# Patient Record
Sex: Male | Born: 1940 | Race: White | Hispanic: No | Marital: Married | Smoking: Former smoker
Health system: Southern US, Community
[De-identification: ages and names within clinical notes are randomized; demographics above are authoritative.]

## PROBLEM LIST (undated history)

## (undated) DIAGNOSIS — E785 Hyperlipidemia, unspecified: Secondary | ICD-10-CM

## (undated) DIAGNOSIS — I10 Essential (primary) hypertension: Secondary | ICD-10-CM

## (undated) DIAGNOSIS — F329 Major depressive disorder, single episode, unspecified: Secondary | ICD-10-CM

## (undated) DIAGNOSIS — F419 Anxiety disorder, unspecified: Secondary | ICD-10-CM

## (undated) DIAGNOSIS — F32A Depression, unspecified: Secondary | ICD-10-CM

## (undated) HISTORY — PX: ROTATOR CUFF REPAIR: SHX139

## (undated) HISTORY — PX: ELBOW FRACTURE SURGERY: SHX616

## (undated) HISTORY — DX: Anxiety disorder, unspecified: F41.9

---

## 1964-02-12 HISTORY — PX: APPENDECTOMY: SHX54

## 1997-09-06 ENCOUNTER — Inpatient Hospital Stay (HOSPITAL_COMMUNITY): Admission: EM | Admit: 1997-09-06 | Discharge: 1997-09-16 | Payer: Self-pay | Admitting: Emergency Medicine

## 2002-12-27 ENCOUNTER — Emergency Department (HOSPITAL_COMMUNITY): Admission: EM | Admit: 2002-12-27 | Discharge: 2002-12-28 | Payer: Self-pay | Admitting: Emergency Medicine

## 2005-03-17 ENCOUNTER — Emergency Department (HOSPITAL_COMMUNITY): Admission: EM | Admit: 2005-03-17 | Discharge: 2005-03-17 | Payer: Self-pay | Admitting: Emergency Medicine

## 2005-06-20 ENCOUNTER — Ambulatory Visit (HOSPITAL_BASED_OUTPATIENT_CLINIC_OR_DEPARTMENT_OTHER): Admission: RE | Admit: 2005-06-20 | Discharge: 2005-06-21 | Payer: Self-pay | Admitting: Orthopedic Surgery

## 2007-06-12 ENCOUNTER — Emergency Department (HOSPITAL_COMMUNITY): Admission: EM | Admit: 2007-06-12 | Discharge: 2007-06-12 | Payer: Self-pay | Admitting: Emergency Medicine

## 2008-03-17 ENCOUNTER — Encounter (INDEPENDENT_AMBULATORY_CARE_PROVIDER_SITE_OTHER): Payer: Self-pay | Admitting: Orthopedic Surgery

## 2008-03-17 ENCOUNTER — Ambulatory Visit (HOSPITAL_BASED_OUTPATIENT_CLINIC_OR_DEPARTMENT_OTHER): Admission: RE | Admit: 2008-03-17 | Discharge: 2008-03-17 | Payer: Self-pay | Admitting: Orthopedic Surgery

## 2010-06-26 NOTE — Op Note (Signed)
[name], [name]                  ACCOUNT NO.:  [id number]   MEDICAL RECORD NO.:  [id number]          PATIENT TYPE:  AMB   LOCATION:  DSC                          FACILITY:  [facility]   PHYSICIAN:  Katy Fitch. Sypher, M.D. DATE OF BIRTH:  1940/05/28   DATE OF PROCEDURE:  03/17/2008  DATE OF DISCHARGE:                               OPERATIVE REPORT   PREOPERATIVE DIAGNOSIS:  Enlarging mass, left palm, question of trauma  history, present for 4 months and painful.   POSTOPERATIVE DIAGNOSIS:  Probable epidermal inclusion cyst.   OPERATIONS:  Excisional biopsy of subdermal mass, left palm.   OPERATIONS:  Katy Fitch. Sypher, MD   ASSISTANT:  Marveen Reeks Dasnoit, PA-C   ANESTHESIA:  Lidocaine 2%, field block of left palm.  No supplemental  sedation was provided.  This was performed in the Minor Operating Room  setting.   INDICATIONS:  Edwin Randall is a well-known patient, referred in the past  by Dr. Merri Brunette for treatment of a right rotator cuff tear.   Edwin Randall has had a mass enlarging in the left palm which is painful and  bothersome.  He thought this might be a wart or a splinter.  It did not  resolve spontaneously, therefore he sought a Hand Surgery consult.  Clinical examination revealed a mass consistent with an epidermal  inclusion cyst or foreign body granuloma.  We recommended excision under  local anesthesia.   PROCEDURE:  Edwin Randall was brought to the operating room and placed in  the supine position upon the operating table.  Following routine  Betadine prep of the palm, 2% lidocaine, a total of 4 mL, was  infiltrated at the base of the mass.  After 5 minutes, excellent  anesthesia was achieved.  The arm was then prepped with Betadine soap  and solution, sterilely draped.  A pneumatic tourniquet was applied to  the proximal brachium.   Due to mild systolic hypertension, the tourniquet was set at 300 mmHg.  The hand and arm were exsanguinated with direct compression  followed by  inflation of the tourniquet.  The mass was ellipsed.  It measured 9 x 10  mm.  A skin fragment measuring approximate 1.5 x 1.2 cm was resected  with the mass down to the level of the palmar fascia.  The mass was  passed off on block.  This would be sent to the lab in formalin for  pathologic evaluation.  My gross clinical impression was that this more  likely did not represent an epidermal inclusion cyst.  The wound is then  closed with series of simple 5-0 nylon sutures.   The wound is dressed with Xeroflo sterile gauze and Ace bandage.   Edwin Randall is instructed to keep his palm dry for 5 days.  He should  begin using foam Band-Aids 3-4 days following surgery.  We will see him  back in followup in a week to 10 days for suture removal and discussion  of his pathology results.      Katy Fitch Sypher, M.D.  Electronically Signed  RVS/MEDQ  D:  03/17/2008  T:  03/17/2008  Job:  [id number]   cc:   Soyla Murphy. Renne Crigler, M.D.

## 2010-06-29 NOTE — Op Note (Signed)
NAMEJUANDIEGO, Edwin Randall                  ACCOUNT NO.:  [id number]   MEDICAL RECORD NO.:  [id number]          PATIENT TYPE:  AMB   LOCATION:  DSC                          FACILITY:  [facility]   PHYSICIAN:  Katy Fitch. Sypher, M.D. DATE OF BIRTH:  1940-03-05   DATE OF PROCEDURE:  06/20/2005  DATE OF DISCHARGE:  06/21/2005                                 OPERATIVE REPORT   PREOPERATIVE DIAGNOSIS:  Massive right rotator cuff acute on chronic  avulsion with a type 3 acromion and severe acromioclavicular degenerative  arthritis, rule out cuff tear arthropathy.   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   OPERATION:  1.  Reconstruction of right rotator cuff including repair of teres minor,      infraspinatus and a portion of the supraspinatus, followed by a bursal      coverage of the anterior humeral head to protect the biceps and the      rotator cuff reconstruction.  2.  Subacromial decompression with resection of a large ossified      coracoacromial ligament.  3.  Resection of distal clavicle.  4.  Arthroscopic evaluation of rotator cuff and glenohumeral joint prior to      open repair, identifying minor labral degenerative changes.   OPERATING SURGEON:  Katy Fitch. Sypher, M.D.   ASSISTANT:  Molly Maduro Dasnoit PA-C.   ANESTHESIA:  General endotracheal supplemented by right interscalene block,  supervising anesthesiologist is Dr. Sampson Goon.   INDICATIONS:  Edwin Randall a 70 year old man referred by Dr. Renne Crigler for  evaluation and management of an acutely painful and weak right shoulder.   Edwin Randall reported an abrupt loss of function of his right shoulder in mid  March when he was using his chainsaw.   At that time he was trying to cut overhead and lost control of his arm.  Since that time he has had pain with shoulder motion and marked weakness  of  abduction and forward flexion of his shoulder.   He was seen in consultation in our office and noted to have clinical signs  of a massive rotator cuff tear.   Plain films at the time demonstrated exceptional subacromial anatomy with  ossification of the coracoacromial ligament and a very large type 3 anterior  bone spur.   He had significant degenerative of AC joint and sclerotic change at the  greater tuberosity consistent with chronic impingement.  There were no signs  of severe glenohumeral degenerative arthritis.   An MRI of the shoulder was obtained, which revealed evidence of a chronic  rotator cuff avulsion with fatty substitution in the supraspinatus muscle.   We had lengthy informed consent with Mr. Edwin Randall.   He was advised that his rotator cuff tear, being acute on chronic, may be  irreparable.  We offered a series of possible interventions including  diagnostic arthroscopy with debridement, followed by possible greater  tuberosityplasty or partial repair of his cuff.  Another alternative should  he have significant pain  and be found to have significant glenohumeral  arthritis due to cuff tear arthropathy would be hemiarthroplasty of the  right shoulder.   After informed consent, he is brought to the operating room at this time  anticipating a procedure that is primarily designed for examination of the  rotator cuff, the quality of the glenohumeral structures and to gauge  whether not he is a candidate for hemiarthroplasty.   He was advised preoperatively should we find evidence of a repairable  rotator cuff injury, we would proceed with partial or complete repair at  this time.   PROCEDURE:  [name] was brought to the operating room and placed in  supine position on the operating table.  Following an anesthesia consult by  Dr. Sampson Goon, general anesthesia by endotracheal technique was advised  with an interscalene block for postoperative analgesia.   The  interscalene block was placed without difficulty in the holding area.   Ancef 1 g was administered as an IV prophylactic antibiotic.   [name] was transferred to room #5, placed in supine position upon the  operating table and under Dr. Jarrett Ables supervision, general endotracheal  anesthesia induced.  He was carefully positioned in the beach-chair position  with a of aid of a torso and head holder designed for shoulder arthroscopy,  followed by prep of the right arm and forequarter with DuraPrep.  Impervious  arthroscopy drapes were applied, followed by instrumentation of the shoulder  with the [employer] arthroscope through a standard posterior viewing portal.   Diagnostic arthroscopy revealed intact hyaline articular cartilage surfaces  on the glenoid and humeral head.  The inferior labrum was degenerative.  There was a complete avulsion of the rotator cuff exposing the biceps  tendon.  The tear extended from the superior subscapularis all the way  posteriorly including the teres minor.   A crescentic, retracted cuff margin was visualized.   This appeared to be mobile and some components of it were hemorrhagic,  suggesting acute on chronic injury.   We made the decision at this point to proceed with attempted partial repair  of the rotator cuff.   A 6 cm incision was fashioned from the [facility] joint across the anterior  acromion.  The anterior third of the deltoid was elevated with a 15 blade  off the anterior acromion and the ossified coracoacromial ligament  identified.   With the aid of an oscillating saw and rongeur, the type 3 acromial spur was  removed in the acromion was leveled to a type 1 morphology.   The lateral flare the acromion was rounded with a power bur.  A complete  bursectomy was accomplished, and with great effort the teres minor and  infraspinatus tendons were recovered from the posterior aspect of the humeral neck region.   The entire head had buttonholed  through remnants of the cuff.  The Long head  of the biceps had a stable anchor and was normal through the rotator  interval.  The superior subscapularis was degenerative; however, I elected  not to proceed with the tendon transfer in that at some point the future Mr.  Akhtar may require an implant arthroplasty of the shoulder and the  subscapularis would be a useful landmark as well as important to the  postoperative stability of an implant arthroplasty.   The cuff was retrieved with two grasping sutures of #2 FiberWire, followed  by use of a power bur to decorticate the greater tuberosity at the  insertions of the teres minor,  infraspinatus and posterior supraspinatus.   The tendon was advanced anteriorly and laterally and inset with two  biocorkscrew anchors for total of four mattress sutures insetting the margin  medially and a single through-bone McLaughlin suture advancing the tendon  anteriorly and laterally.   The bursa and remnants of the supraspinous tendon were then gathered  medially and anteriorly and with a baseball-type stitch, this was all  repaired over the humeral head leading to soft tissue interposition between  the acromion and the decorticated greater tuberosity.   This was repaired with excellent coverage of the humeral head.   This also reinforced the repair and should prevent recurrent posterior  subluxation of the infraspinatus and teres minor.   After completion of the repair, the subacromial space and bursa were  thoroughly lavage with sterile saline and redundant bursa resected with  scissors and rongeur dissection.  After hemostasis was achieved, the bursa  was thoroughly irrigated with the arthroscopic pump, followed by resection  of the distal 15 mm of clavicle.   After the marginal osteophytes at the [facility] joint were removed, the anterior  deltoid and trapezius muscles were repaired with mattress suture of #2  FiberWire, closing the dead space created  by distal clavicle resection,  followed by anatomic repair of the anterior deltoid to the acromion with  through-tendon and through-periosteal sutures.   A satisfactory reconstruction the cuff was achieved with a strong repair of  the deltoid.   Mr. Arney will be advised to perform passive exercises only for  approximately six weeks due to some jeopardized areas of the infraspinatus  that were noted posteriorly.   Will not stress the repair with any active motion until approximately eight  weeks postop.  There were no apparent complications.      Katy Fitch Sypher, M.D.  Electronically Signed     RVS/MEDQ  D:  06/20/2005  T:  06/21/2005  Job:  [id number]   cc:   Soyla Murphy. Renne Crigler, M.D.  Fax: [phone]

## 2011-05-15 DIAGNOSIS — Z79899 Other long term (current) drug therapy: Secondary | ICD-10-CM | POA: Diagnosis not present

## 2011-05-15 DIAGNOSIS — I10 Essential (primary) hypertension: Secondary | ICD-10-CM | POA: Diagnosis not present

## 2011-05-15 DIAGNOSIS — Z125 Encounter for screening for malignant neoplasm of prostate: Secondary | ICD-10-CM | POA: Diagnosis not present

## 2011-05-15 DIAGNOSIS — E7800 Pure hypercholesterolemia, unspecified: Secondary | ICD-10-CM | POA: Diagnosis not present

## 2011-05-20 DIAGNOSIS — E7800 Pure hypercholesterolemia, unspecified: Secondary | ICD-10-CM | POA: Diagnosis not present

## 2011-05-20 DIAGNOSIS — F411 Generalized anxiety disorder: Secondary | ICD-10-CM | POA: Diagnosis not present

## 2011-05-20 DIAGNOSIS — Z23 Encounter for immunization: Secondary | ICD-10-CM | POA: Diagnosis not present

## 2011-05-20 DIAGNOSIS — I10 Essential (primary) hypertension: Secondary | ICD-10-CM | POA: Diagnosis not present

## 2011-05-20 DIAGNOSIS — M109 Gout, unspecified: Secondary | ICD-10-CM | POA: Diagnosis not present

## 2012-05-15 DIAGNOSIS — Z0000 Encounter for general adult medical examination without abnormal findings: Secondary | ICD-10-CM | POA: Diagnosis not present

## 2012-05-15 DIAGNOSIS — I10 Essential (primary) hypertension: Secondary | ICD-10-CM | POA: Diagnosis not present

## 2012-05-15 DIAGNOSIS — Z79899 Other long term (current) drug therapy: Secondary | ICD-10-CM | POA: Diagnosis not present

## 2012-05-15 DIAGNOSIS — Z125 Encounter for screening for malignant neoplasm of prostate: Secondary | ICD-10-CM | POA: Diagnosis not present

## 2012-05-15 DIAGNOSIS — E7800 Pure hypercholesterolemia, unspecified: Secondary | ICD-10-CM | POA: Diagnosis not present

## 2012-05-21 DIAGNOSIS — Z1212 Encounter for screening for malignant neoplasm of rectum: Secondary | ICD-10-CM | POA: Diagnosis not present

## 2012-05-21 DIAGNOSIS — I10 Essential (primary) hypertension: Secondary | ICD-10-CM | POA: Diagnosis not present

## 2012-05-21 DIAGNOSIS — F411 Generalized anxiety disorder: Secondary | ICD-10-CM | POA: Diagnosis not present

## 2012-05-21 DIAGNOSIS — M109 Gout, unspecified: Secondary | ICD-10-CM | POA: Diagnosis not present

## 2012-05-21 DIAGNOSIS — Z87820 Personal history of traumatic brain injury: Secondary | ICD-10-CM | POA: Diagnosis not present

## 2012-07-02 DIAGNOSIS — E871 Hypo-osmolality and hyponatremia: Secondary | ICD-10-CM | POA: Diagnosis not present

## 2012-10-06 DIAGNOSIS — W19XXXA Unspecified fall, initial encounter: Secondary | ICD-10-CM | POA: Diagnosis not present

## 2012-10-06 DIAGNOSIS — I10 Essential (primary) hypertension: Secondary | ICD-10-CM | POA: Diagnosis not present

## 2012-10-06 DIAGNOSIS — M545 Low back pain: Secondary | ICD-10-CM | POA: Diagnosis not present

## 2013-05-26 DIAGNOSIS — E7800 Pure hypercholesterolemia, unspecified: Secondary | ICD-10-CM | POA: Diagnosis not present

## 2013-05-26 DIAGNOSIS — Z125 Encounter for screening for malignant neoplasm of prostate: Secondary | ICD-10-CM | POA: Diagnosis not present

## 2013-05-26 DIAGNOSIS — I10 Essential (primary) hypertension: Secondary | ICD-10-CM | POA: Diagnosis not present

## 2013-05-26 DIAGNOSIS — Z0000 Encounter for general adult medical examination without abnormal findings: Secondary | ICD-10-CM | POA: Diagnosis not present

## 2013-05-26 DIAGNOSIS — Z7982 Long term (current) use of aspirin: Secondary | ICD-10-CM | POA: Diagnosis not present

## 2013-05-31 DIAGNOSIS — E7800 Pure hypercholesterolemia, unspecified: Secondary | ICD-10-CM | POA: Diagnosis not present

## 2013-05-31 DIAGNOSIS — I70209 Unspecified atherosclerosis of native arteries of extremities, unspecified extremity: Secondary | ICD-10-CM | POA: Diagnosis not present

## 2013-05-31 DIAGNOSIS — E8881 Metabolic syndrome: Secondary | ICD-10-CM | POA: Diagnosis not present

## 2013-05-31 DIAGNOSIS — I10 Essential (primary) hypertension: Secondary | ICD-10-CM | POA: Diagnosis not present

## 2013-05-31 DIAGNOSIS — M6740 Ganglion, unspecified site: Secondary | ICD-10-CM | POA: Diagnosis not present

## 2013-08-11 DIAGNOSIS — M6740 Ganglion, unspecified site: Secondary | ICD-10-CM | POA: Diagnosis not present

## 2013-11-30 DIAGNOSIS — E780 Pure hypercholesterolemia: Secondary | ICD-10-CM | POA: Diagnosis not present

## 2013-11-30 DIAGNOSIS — I10 Essential (primary) hypertension: Secondary | ICD-10-CM | POA: Diagnosis not present

## 2013-12-06 DIAGNOSIS — M71332 Other bursal cyst, left wrist: Secondary | ICD-10-CM | POA: Diagnosis not present

## 2013-12-06 DIAGNOSIS — I10 Essential (primary) hypertension: Secondary | ICD-10-CM | POA: Diagnosis not present

## 2014-01-05 DIAGNOSIS — M71332 Other bursal cyst, left wrist: Secondary | ICD-10-CM | POA: Diagnosis not present

## 2014-01-05 DIAGNOSIS — I10 Essential (primary) hypertension: Secondary | ICD-10-CM | POA: Diagnosis not present

## 2014-02-10 ENCOUNTER — Emergency Department (HOSPITAL_COMMUNITY): Payer: Medicare Other

## 2014-02-10 ENCOUNTER — Encounter (HOSPITAL_COMMUNITY): Payer: Self-pay | Admitting: *Deleted

## 2014-02-10 ENCOUNTER — Emergency Department (HOSPITAL_COMMUNITY)
Admission: EM | Admit: 2014-02-10 | Discharge: 2014-02-10 | Disposition: A | Discharge status: Home / Self Care | Payer: Medicare Other | Attending: Emergency Medicine | Admitting: Emergency Medicine

## 2014-02-10 DIAGNOSIS — I10 Essential (primary) hypertension: Secondary | ICD-10-CM | POA: Diagnosis not present

## 2014-02-10 DIAGNOSIS — S6992XA Unspecified injury of left wrist, hand and finger(s), initial encounter: Secondary | ICD-10-CM | POA: Diagnosis not present

## 2014-02-10 DIAGNOSIS — M25522 Pain in left elbow: Secondary | ICD-10-CM | POA: Diagnosis not present

## 2014-02-10 DIAGNOSIS — S42402A Unspecified fracture of lower end of left humerus, initial encounter for closed fracture: Secondary | ICD-10-CM | POA: Insufficient documentation

## 2014-02-10 DIAGNOSIS — Y9389 Activity, other specified: Secondary | ICD-10-CM | POA: Diagnosis not present

## 2014-02-10 DIAGNOSIS — Y9289 Other specified places as the place of occurrence of the external cause: Secondary | ICD-10-CM | POA: Insufficient documentation

## 2014-02-10 DIAGNOSIS — S59902A Unspecified injury of left elbow, initial encounter: Secondary | ICD-10-CM | POA: Diagnosis present

## 2014-02-10 DIAGNOSIS — S52022A Displaced fracture of olecranon process without intraarticular extension of left ulna, initial encounter for closed fracture: Secondary | ICD-10-CM | POA: Diagnosis not present

## 2014-02-10 DIAGNOSIS — W19XXXA Unspecified fall, initial encounter: Secondary | ICD-10-CM

## 2014-02-10 DIAGNOSIS — Y998 Other external cause status: Secondary | ICD-10-CM | POA: Diagnosis not present

## 2014-02-10 DIAGNOSIS — W010XXA Fall on same level from slipping, tripping and stumbling without subsequent striking against object, initial encounter: Secondary | ICD-10-CM | POA: Diagnosis not present

## 2014-02-10 DIAGNOSIS — M7989 Other specified soft tissue disorders: Secondary | ICD-10-CM | POA: Diagnosis not present

## 2014-02-10 HISTORY — DX: Essential (primary) hypertension: I10

## 2014-02-10 LAB — CBC WITH DIFFERENTIAL/PLATELET
Basophils Absolute: 0 10*3/uL (ref 0.0–0.1)
Basophils Relative: 0 % (ref 0–1)
Eosinophils Absolute: 0.1 10*3/uL (ref 0.0–0.7)
Eosinophils Relative: 1 % (ref 0–5)
HCT: 40.8 % (ref 39.0–52.0)
Hemoglobin: 14.2 g/dL (ref 13.0–17.0)
Lymphocytes Relative: 21 % (ref 12–46)
Lymphs Abs: 1.9 10*3/uL (ref 0.7–4.0)
MCH: 31.6 pg (ref 26.0–34.0)
MCHC: 34.8 g/dL (ref 30.0–36.0)
MCV: 90.7 fL (ref 78.0–100.0)
Monocytes Absolute: 0.8 10*3/uL (ref 0.1–1.0)
Monocytes Relative: 9 % (ref 3–12)
Neutro Abs: 6.3 10*3/uL (ref 1.7–7.7)
Neutrophils Relative %: 69 % (ref 43–77)
Platelets: 207 10*3/uL (ref 150–400)
RBC: 4.5 MIL/uL (ref 4.22–5.81)
RDW: 13.3 % (ref 11.5–15.5)
WBC: 9.2 10*3/uL (ref 4.0–10.5)

## 2014-02-10 LAB — COMPREHENSIVE METABOLIC PANEL
ALT: 38 U/L (ref 0–53)
AST: 41 U/L — ABNORMAL HIGH (ref 0–37)
Albumin: 3.8 g/dL (ref 3.5–5.2)
Alkaline Phosphatase: 57 U/L (ref 39–117)
Anion gap: 8 (ref 5–15)
BUN: 12 mg/dL (ref 6–23)
CO2: 27 mmol/L (ref 19–32)
Calcium: 9.2 mg/dL (ref 8.4–10.5)
Chloride: 105 mEq/L (ref 96–112)
Creatinine, Ser: 0.95 mg/dL (ref 0.50–1.35)
GFR calc Af Amer: >90 mL/min (ref >90)
GFR calc non Af Amer: 80 mL/min — ABNORMAL LOW (ref >90)
Glucose, Bld: 104 mg/dL — ABNORMAL HIGH (ref 70–99)
Potassium: 3.9 mmol/L (ref 3.5–5.1)
Sodium: 140 mmol/L (ref 135–145)
Total Bilirubin: 0.5 mg/dL (ref 0.3–1.2)
Total Protein: 6.1 g/dL (ref 6.0–8.3)

## 2014-02-10 LAB — TROPONIN I: Troponin I: <0.03 ng/mL (ref <0.031)

## 2014-02-10 LAB — ETHANOL: Alcohol, Ethyl (B): 139 mg/dL — ABNORMAL HIGH (ref 0–9)

## 2014-02-10 MED ORDER — OXYCODONE-ACETAMINOPHEN 5-325 MG PO TABS: 1.0000 | ORAL_TABLET | Freq: Four times a day (QID) | ORAL | Status: DC | PRN

## 2014-02-10 MED ORDER — OXYCODONE-ACETAMINOPHEN 5-325 MG PO TABS
1.0000 | ORAL_TABLET | Freq: Once | ORAL | Status: AC
  Administered 2014-02-10: 1 via ORAL
  Filled 2014-02-10: qty 1

## 2014-02-10 MED ORDER — HYDROMORPHONE HCL 1 MG/ML IJ SOLN
1.0000 mg | Freq: Once | INTRAMUSCULAR | Status: AC
  Administered 2014-02-10: 1 mg via INTRAVENOUS
  Filled 2014-02-10: qty 1

## 2014-02-10 MED ORDER — ONDANSETRON 4 MG PO TBDP
4.0000 mg | ORAL_TABLET | Freq: Once | ORAL | Status: AC
  Administered 2014-02-10: 4 mg via ORAL
  Filled 2014-02-10: qty 1

## 2014-02-10 NOTE — Discharge Instructions (Signed)
Elbow Fracture, Simple A fracture is a break in one of the bones.When fractures are not displaced or separated, they may be treated with only a sling or splint. The sling or splint may only be required for two to three weeks. In these cases, often the elbow is put through early range of motion exercises to prevent the elbow from getting stiff. DIAGNOSIS  The diagnosis (learning what is wrong) of a fractured elbow is made by x-ray. These may be required before and after the elbow is put into a splint or cast. X-rays are taken after to make sure the bone pieces have not moved. HOME CARE INSTRUCTIONS   Only take over-the-counter or prescription medicines for pain, discomfort, or fever as directed by your caregiver.  If you have a splint held on with an elastic wrap and your hand or fingers become numb or cold and blue, loosen the wrap and reapply more loosely. See your caregiver if there is no relief.  You may use ice for twenty minutes, four times per day, for the first two to three days.  Use your elbow as directed.  See your caregiver as directed. It is very important to keep all follow-up referrals and appointments in order to avoid any long-term problems with your elbow including chronic pain or stiffness. SEEK IMMEDIATE MEDICAL CARE IF:   There is swelling or increasing pain in elbow.  You begin to lose feeling or experience numbness or tingling in your hand or fingers.  You develop swelling of the hand and fingers.  You get a cold or blue hand or fingers on affected side. MAKE SURE YOU:   Understand these instructions.  Will watch your condition.  Will get help right away if you are not doing well or get worse. Document Released: 01/22/2001 Document Revised: 04/22/2011 Document Reviewed: 12/13/2008 Exit[employer] Patient Information 2015 [employer]. This information is not intended to replace advice given to you by your health care provider. Make sure you discuss any questions you  have with your health care provider.  

## 2014-02-10 NOTE — ED Notes (Signed)
Pt reports falling today. Has hx of arm injury in past, reports normally that his arm does not flex or extend since injury, "he felt something move today when he fell." pt thinks arm is broken, he immobilized it pta with old splint. +radial pulse and able to move digits.

## 2014-02-10 NOTE — ED Provider Notes (Signed)
CSN: [id number]     Arrival date & time 02/10/14  1556 History   First MD Initiated Contact with Patient 02/10/14 1815     Chief Complaint  Patient presents with  . Fall  . Arm Injury     (Consider location/radiation/quality/duration/timing/severity/associated sxs/prior Treatment) Patient is a 73 y.o. male presenting with fall and arm injury. The history is provided by the patient.  Fall This is a new problem. Pertinent negatives include no chest pain, no abdominal pain and no shortness of breath.  Arm Injury Associated symptoms: no fatigue    patient slipped and fell prior to arrival. He landed on his left arm and hit his face. He denies loss consciousness. His only complaint is of pain in his left elbow. He has had previous surgeries to this elbow and has not been able flex or extend it for the last 50 years. He states he had surgery done 20 years ago. No shoulder pain. While in radiology getting an elbow x-ray done the had a syncopal episode. Patient states he just passed out. Patient's wife was there and states that he was unconscious for a while. Patient thinks it is just due to the pain. No chest pain. No headache.  Past Medical History  Diagnosis Date  . Hypertension    Past Surgical History  Procedure Laterality Date  . Arm surgery     History reviewed. No pertinent family history. History  Substance Use Topics  . Smoking status: Not on file  . Smokeless tobacco: Not on file  . Alcohol Use: No    Review of Systems  Constitutional: Negative for fatigue.  Respiratory: Negative for shortness of breath.   Cardiovascular: Negative for chest pain.  Gastrointestinal: Negative for abdominal pain.  Skin: Negative for wound.  Neurological: Negative for syncope.      Allergies  Demerol  Home Medications   Prior to Admission medications   Medication Sig Start Date End Date Taking? Authorizing Provider  oxyCODONE-acetaminophen (PERCOCET/ROXICET) 5-325 MG per tablet  Take 1-2 tablets by mouth every 6 (six) hours as needed for severe pain. 02/10/14   Juliet RudeNathan R. Kalia Vahey, MD   BP 114/66 mmHg  Pulse 79  Temp(Src) 98.1 F (36.7 C) (Oral)  Resp 13  SpO2 98% Physical Exam  Constitutional: He appears well-developed and well-nourished.  HENT:  Head: Normocephalic and atraumatic.  Eyes: Pupils are equal, round, and reactive to light.  Neck: Neck supple.  Cardiovascular: Normal rate and regular rhythm.   Pulmonary/Chest: Effort normal.  Musculoskeletal: He exhibits tenderness.  Swelling of left elbow. Chronic immobility and elbow. No tenderness over wrist. Some effusion over left elbow. No shoulder tenderness. Skin intact. Some wasting of the biceps and try to set some left upper arm.  Neurological: He is alert.  Skin: Skin is warm.    ED Course  Procedures (including critical care time) Labs Review Labs Reviewed  ETHANOL - Abnormal; Notable for the following:    Alcohol, Ethyl (B) 139 (*)    All other components within normal limits  COMPREHENSIVE METABOLIC PANEL - Abnormal; Notable for the following:    Glucose, Bld 104 (*)    AST 41 (*)    GFR calc non Af Amer 80 (*)    All other components within normal limits  CBC WITH DIFFERENTIAL  TROPONIN I    Imaging Review Dg Elbow 2 Views Left  02/10/2014   CLINICAL DATA:  Initial encounter for left elbow pain.  EXAM: LEFT ELBOW - 2 VIEW  COMPARISON:  None.  FINDINGS: A single oblique lateral view of the left elbow was obtained. Patient at significant clinical issues in the exam could not be completed. The single view shows 2 screws in the distal humerus. Assessment is limited, but there appears to be marked posttraumatic deformity of the distal humerus. Relationship of the radius and ulna to the humerus cannot be determined on this study.  IMPRESSION: Limited study. Marked deformity of the elbow, likely secondary to remote trauma. Repeat imaging with the patient is more stable is recommended.    Electronically Signed   By: Kennith Center M.D.   On: 02/10/2014 18:28   Dg Elbow Complete Left  02/10/2014   CLINICAL DATA:  Acute left elbow pain after falling today.  EXAM: LEFT ELBOW - COMPLETE 3+ VIEW  COMPARISON:  February 10, 2014.  FINDINGS: Severe deformity of distal humerus is noted with broken fixation screw present consistent with old fracture. However, there appears to be a new moderately displaced fracture involving the olecranon. It appears to be closed and post traumatic. It appears to be comminuted with overlying soft tissue swelling.  IMPRESSION: Deformity of distal left humerus consistent with old surgically treated fracture. New moderately displaced fracture is seen involving the olecranon.   Electronically Signed   By: Roque Lias M.D.   On: 02/10/2014 19:34   Dg Wrist Complete Left  02/10/2014   CLINICAL DATA:  Wrist pain.  Fall.  EXAM: LEFT WRIST - COMPLETE 3+ VIEW  COMPARISON:  None.  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 Scaphoid bone is intact. There is no fracture at the wrist. 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 Second MCP joint osteoarthritis. Moderate first MCP joint osteoarthritis. Distal radius and ulna appear intact. There is soft tissue swelling around the wrist but no displaced fracture is identified.  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   Electronically Signed   By: Andreas Newport M.D.   On: 02/10/2014 18:10     EKG Interpretation None      MDM   Final diagnoses:  Fall  Elbow fracture, left, closed, initial encounter    Patient with fall. Elbow fracture but patient has baseline no movement in the elbow. Discussed with hand surgery, Dr. Melvyn Novas. He reviewed the images. Patient has a splint that he has worn previously, but it will not fit at this point due to swelling. Patient was given a posterior splint by  Ortho tec patient was discharged home with pain medicines.h.     Juliet Rude. Rubin Payor, MD 02/10/14 2322

## 2014-02-10 NOTE — ED Notes (Signed)
Ortho tech at bedside 

## 2014-02-10 NOTE — ED Notes (Signed)
Pt. Passed out sitting in chair in radiology. Upon arrival to room patient is alert and oriented x4. Family at bedside. States "It was probably because of the pain".

## 2014-02-10 NOTE — ED Notes (Signed)
EDP at bedside  

## 2014-02-10 NOTE — Progress Notes (Signed)
Orthopedic Tech Progress Note Patient Details:  Edwin Randall 1940-04-04 [id number]  Ortho Devices Type of Ortho Device: Ace wrap, Post (long arm) splint, Arm sling Ortho Device/Splint Location: LUE Ortho Device/Splint Interventions: Ordered, Application   Jennye Moccasin 02/10/2014, 9:10 PM

## 2014-02-14 DIAGNOSIS — S52032A Displaced fracture of olecranon process with intraarticular extension of left ulna, initial encounter for closed fracture: Secondary | ICD-10-CM | POA: Diagnosis not present

## 2014-03-01 DIAGNOSIS — S52032D Displaced fracture of olecranon process with intraarticular extension of left ulna, subsequent encounter for closed fracture with routine healing: Secondary | ICD-10-CM | POA: Diagnosis not present

## 2014-03-22 DIAGNOSIS — S52032D Displaced fracture of olecranon process with intraarticular extension of left ulna, subsequent encounter for closed fracture with routine healing: Secondary | ICD-10-CM | POA: Diagnosis not present

## 2014-05-03 DIAGNOSIS — S52032D Displaced fracture of olecranon process with intraarticular extension of left ulna, subsequent encounter for closed fracture with routine healing: Secondary | ICD-10-CM | POA: Diagnosis not present

## 2014-06-27 DIAGNOSIS — Z0000 Encounter for general adult medical examination without abnormal findings: Secondary | ICD-10-CM | POA: Diagnosis not present

## 2014-06-27 DIAGNOSIS — Z7982 Long term (current) use of aspirin: Secondary | ICD-10-CM | POA: Diagnosis not present

## 2014-06-27 DIAGNOSIS — I10 Essential (primary) hypertension: Secondary | ICD-10-CM | POA: Diagnosis not present

## 2014-06-27 DIAGNOSIS — E780 Pure hypercholesterolemia: Secondary | ICD-10-CM | POA: Diagnosis not present

## 2014-06-27 DIAGNOSIS — Z125 Encounter for screening for malignant neoplasm of prostate: Secondary | ICD-10-CM | POA: Diagnosis not present

## 2014-07-01 DIAGNOSIS — Z1212 Encounter for screening for malignant neoplasm of rectum: Secondary | ICD-10-CM | POA: Diagnosis not present

## 2014-07-01 DIAGNOSIS — Z7982 Long term (current) use of aspirin: Secondary | ICD-10-CM | POA: Diagnosis not present

## 2014-07-01 DIAGNOSIS — E780 Pure hypercholesterolemia: Secondary | ICD-10-CM | POA: Diagnosis not present

## 2014-07-01 DIAGNOSIS — L570 Actinic keratosis: Secondary | ICD-10-CM | POA: Diagnosis not present

## 2014-07-01 DIAGNOSIS — I10 Essential (primary) hypertension: Secondary | ICD-10-CM | POA: Diagnosis not present

## 2014-08-18 DIAGNOSIS — S52032D Displaced fracture of olecranon process with intraarticular extension of left ulna, subsequent encounter for closed fracture with routine healing: Secondary | ICD-10-CM | POA: Diagnosis not present

## 2015-01-11 DIAGNOSIS — E7800 Pure hypercholesterolemia, unspecified: Secondary | ICD-10-CM | POA: Diagnosis not present

## 2015-01-13 DIAGNOSIS — I10 Essential (primary) hypertension: Secondary | ICD-10-CM | POA: Diagnosis not present

## 2015-01-13 DIAGNOSIS — D692 Other nonthrombocytopenic purpura: Secondary | ICD-10-CM | POA: Diagnosis not present

## 2015-01-27 DIAGNOSIS — M25422 Effusion, left elbow: Secondary | ICD-10-CM | POA: Diagnosis not present

## 2015-01-27 DIAGNOSIS — M25522 Pain in left elbow: Secondary | ICD-10-CM | POA: Diagnosis not present

## 2015-01-27 DIAGNOSIS — S42402P Unspecified fracture of lower end of left humerus, subsequent encounter for fracture with malunion: Secondary | ICD-10-CM | POA: Diagnosis not present

## 2015-02-02 DIAGNOSIS — M25522 Pain in left elbow: Secondary | ICD-10-CM | POA: Diagnosis not present

## 2015-02-02 DIAGNOSIS — S42402P Unspecified fracture of lower end of left humerus, subsequent encounter for fracture with malunion: Secondary | ICD-10-CM | POA: Diagnosis not present

## 2015-02-02 DIAGNOSIS — M25422 Effusion, left elbow: Secondary | ICD-10-CM | POA: Diagnosis not present

## 2015-02-03 DIAGNOSIS — H2513 Age-related nuclear cataract, bilateral: Secondary | ICD-10-CM | POA: Diagnosis not present

## 2015-02-03 DIAGNOSIS — H02052 Trichiasis without entropian right lower eyelid: Secondary | ICD-10-CM | POA: Diagnosis not present

## 2015-02-03 DIAGNOSIS — H02831 Dermatochalasis of right upper eyelid: Secondary | ICD-10-CM | POA: Diagnosis not present

## 2015-02-03 DIAGNOSIS — H02834 Dermatochalasis of left upper eyelid: Secondary | ICD-10-CM | POA: Diagnosis not present

## 2015-02-03 DIAGNOSIS — H04123 Dry eye syndrome of bilateral lacrimal glands: Secondary | ICD-10-CM | POA: Diagnosis not present

## 2015-02-03 DIAGNOSIS — H40053 Ocular hypertension, bilateral: Secondary | ICD-10-CM | POA: Diagnosis not present

## 2015-02-23 DIAGNOSIS — M25422 Effusion, left elbow: Secondary | ICD-10-CM | POA: Diagnosis not present

## 2015-02-23 DIAGNOSIS — M25522 Pain in left elbow: Secondary | ICD-10-CM | POA: Diagnosis not present

## 2015-02-23 DIAGNOSIS — S42402P Unspecified fracture of lower end of left humerus, subsequent encounter for fracture with malunion: Secondary | ICD-10-CM | POA: Diagnosis not present

## 2015-03-23 DIAGNOSIS — S59902D Unspecified injury of left elbow, subsequent encounter: Secondary | ICD-10-CM | POA: Diagnosis not present

## 2015-03-23 DIAGNOSIS — S42402P Unspecified fracture of lower end of left humerus, subsequent encounter for fracture with malunion: Secondary | ICD-10-CM | POA: Diagnosis not present

## 2015-04-11 IMAGING — CR DG WRIST COMPLETE 3+V*L*
4 series · 4 of 4 positions shown · non-contrast
Comparison: None.

CLINICAL DATA: Wrist pain.  Fall.

EXAM:
LEFT WRIST - COMPLETE 3+ VIEW

[wrist pa]
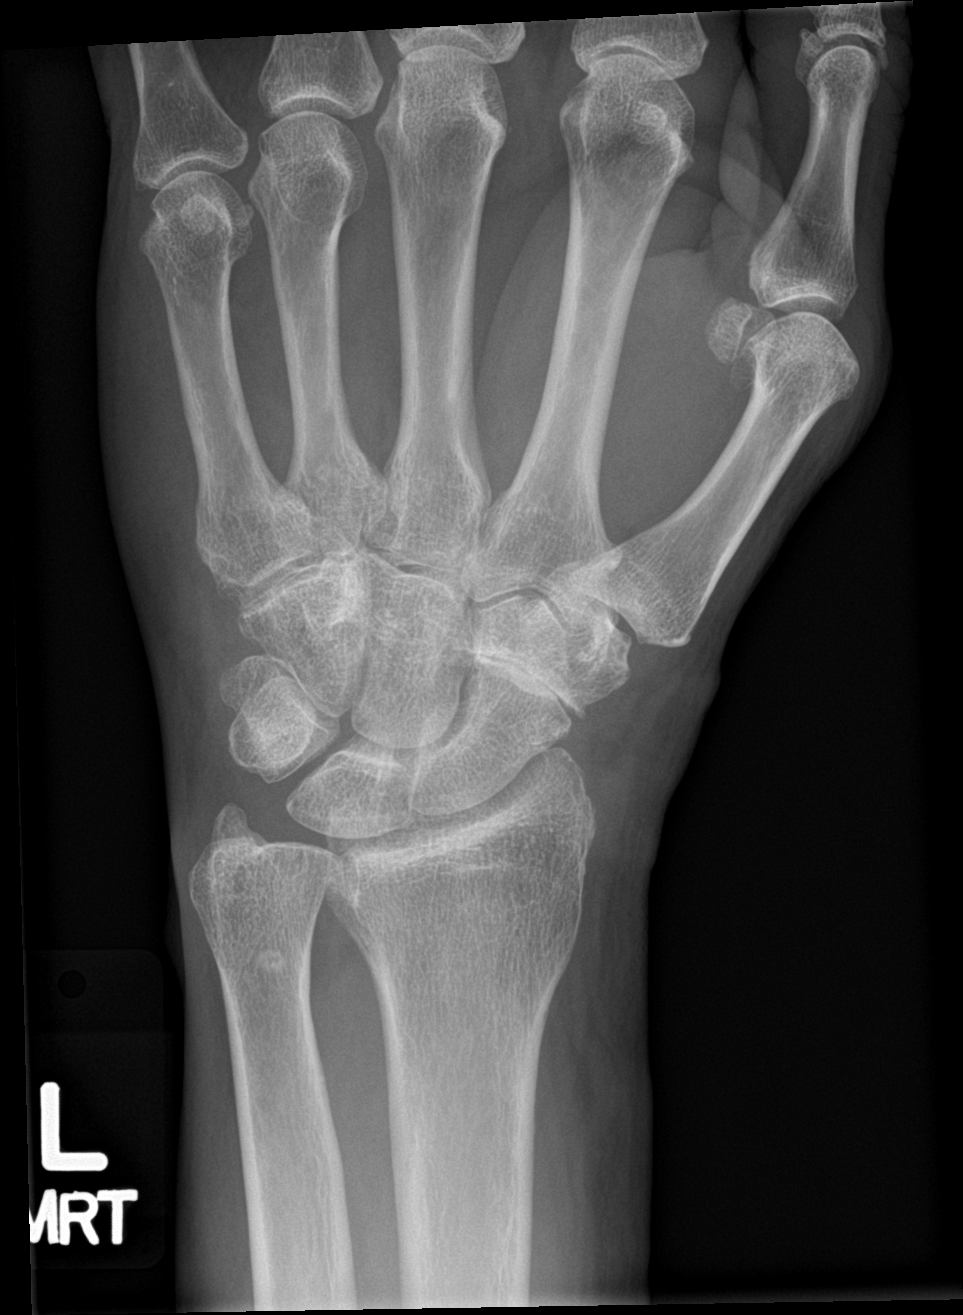

[wrist obl]
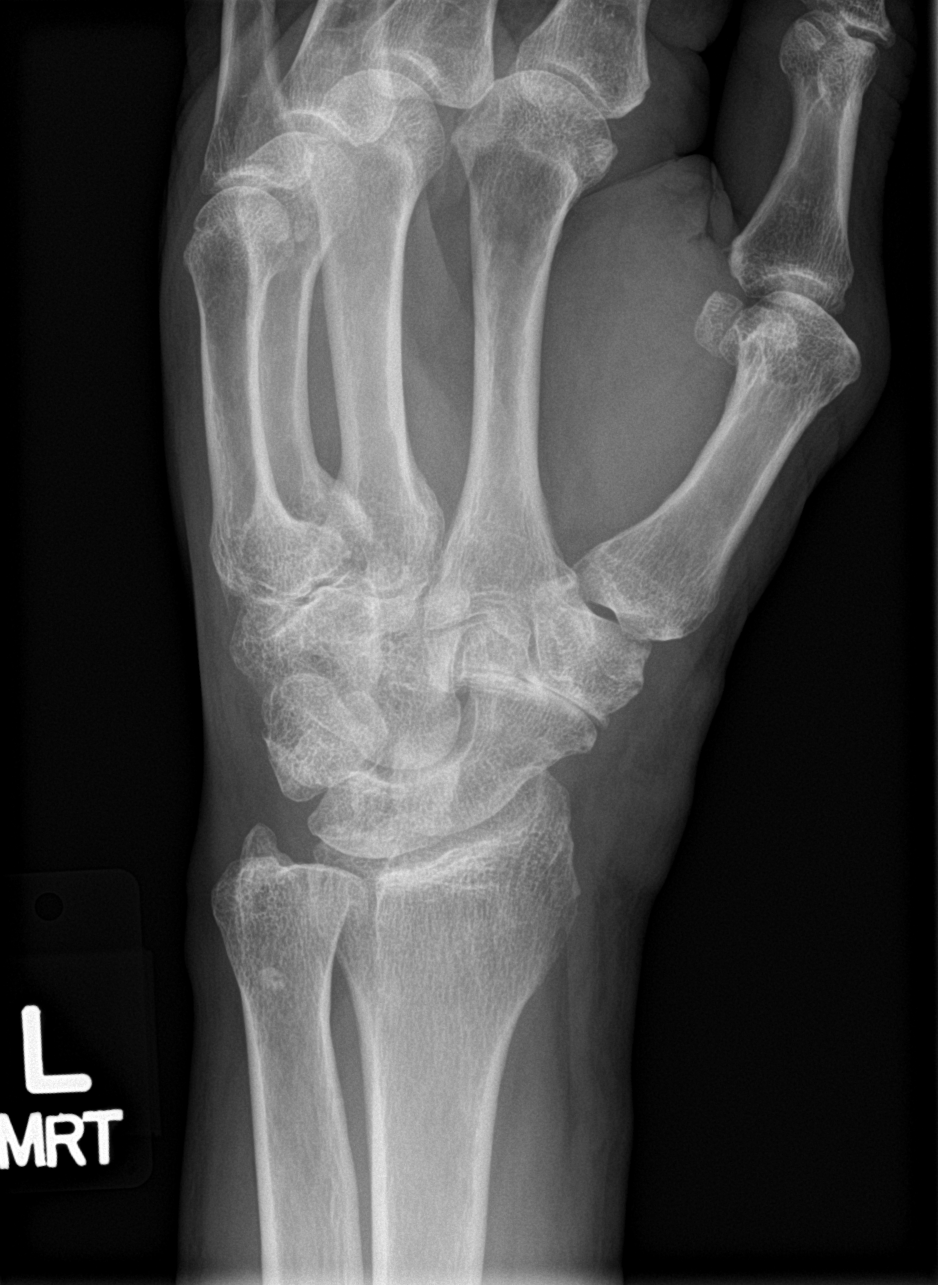

[wrist lat]
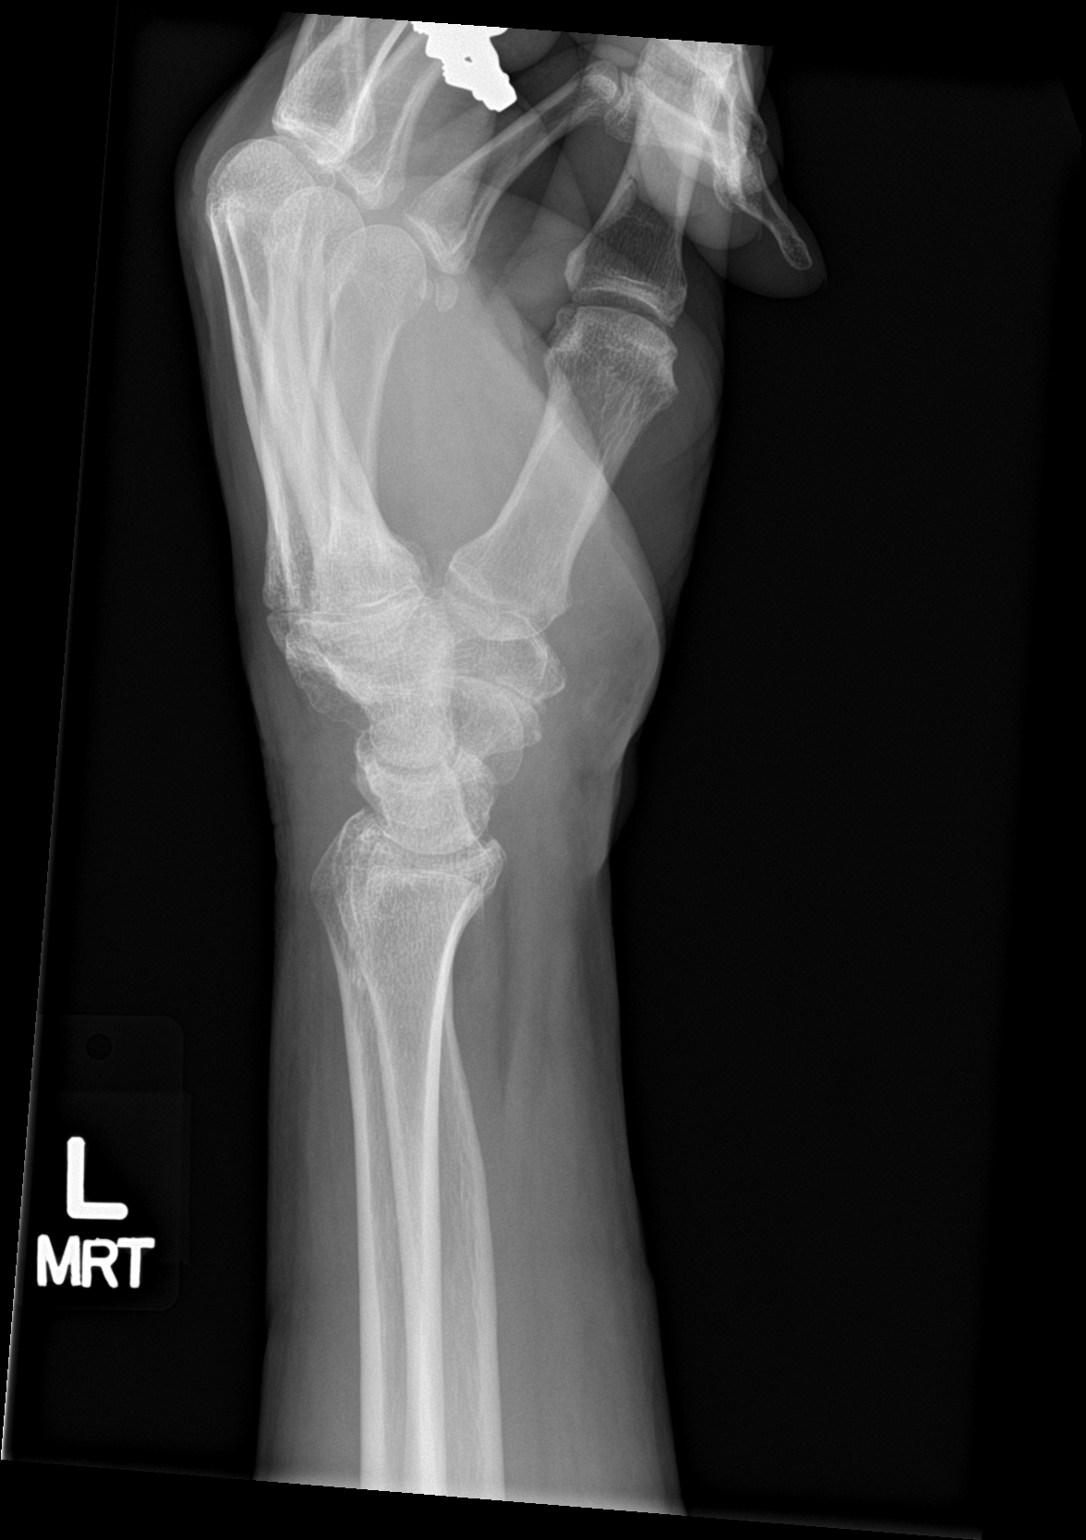

[wrist navicular]
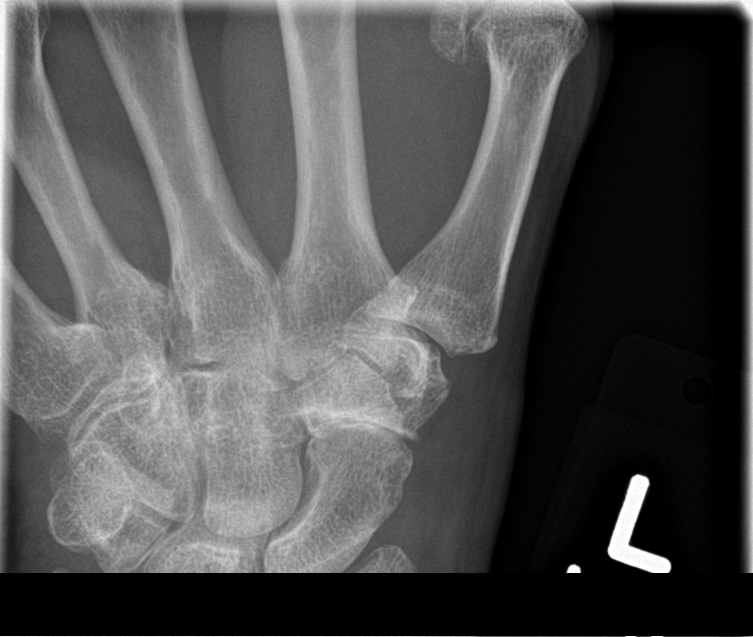

[4 of 4 positions shown; findings below may reference images not displayed]

FINDINGS: Severe STT joint osteoarthritis and mild basal joint of the thumb
osteoarthritis. Scaphoid bone is intact. There is no fracture at the
wrist. Tiny likely calcified enchondroma in the distal ulnar
metaphysis. Second MCP joint osteoarthritis. Moderate first MCP
joint osteoarthritis. Distal radius and ulna appear intact. There is
soft tissue swelling around the wrist but no displaced fracture is
identified.
IMPRESSION: Soft tissue swelling and osteoarthritis of the wrist without an
acute osseous abnormality.

## 2015-04-11 IMAGING — CR DG ELBOW COMPLETE 3+V*L*
4 series · 4 of 4 positions shown · non-contrast
Comparison: February 10, 2014.

CLINICAL DATA: Acute left elbow pain after falling today.

EXAM:
LEFT ELBOW - COMPLETE 3+ VIEW

[elbow ap]
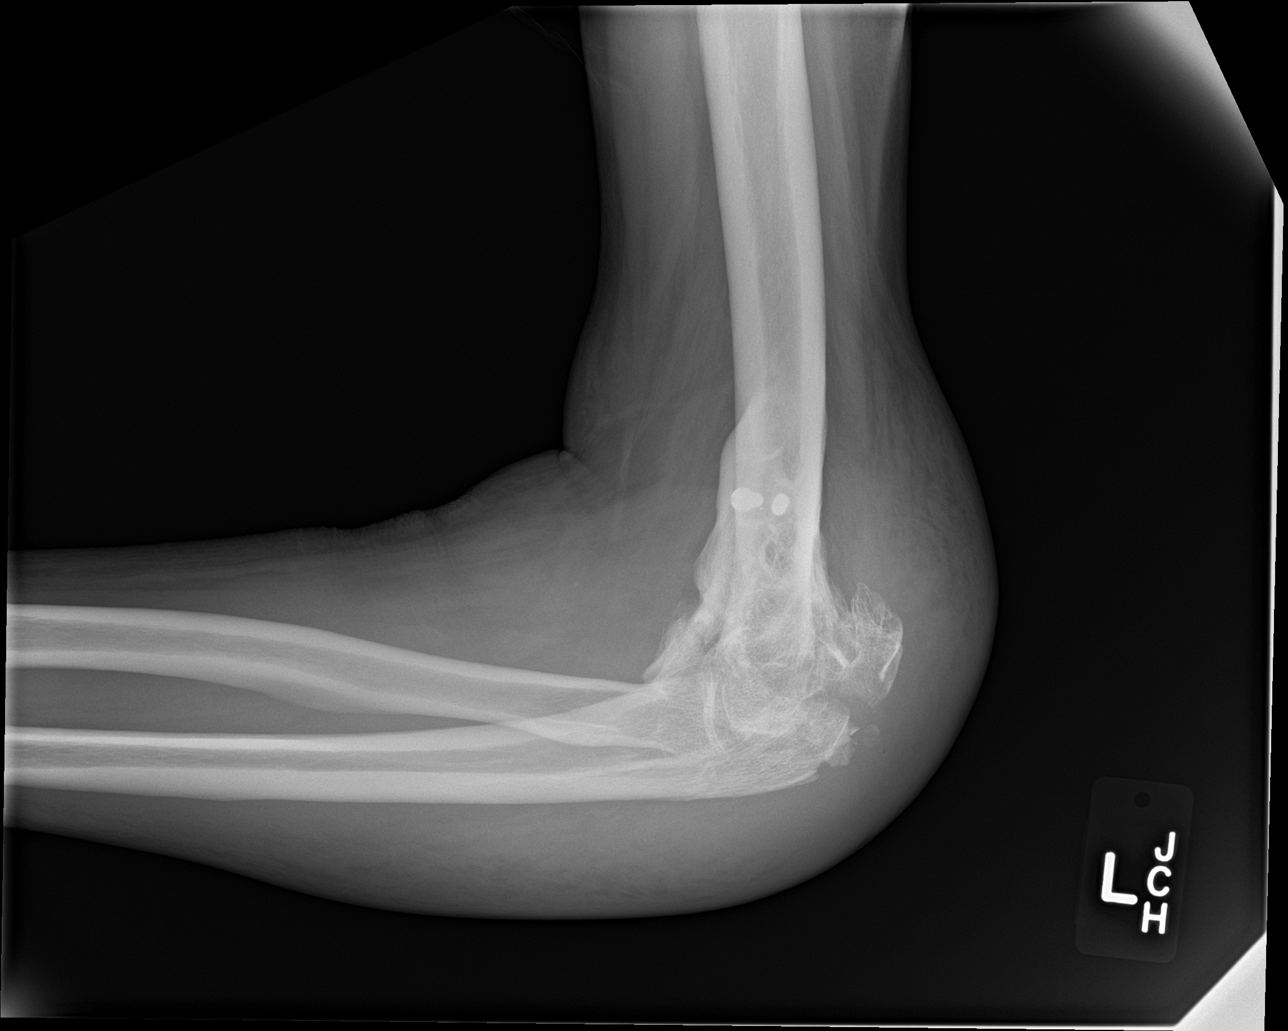

[elbow obl (1 of 2)]
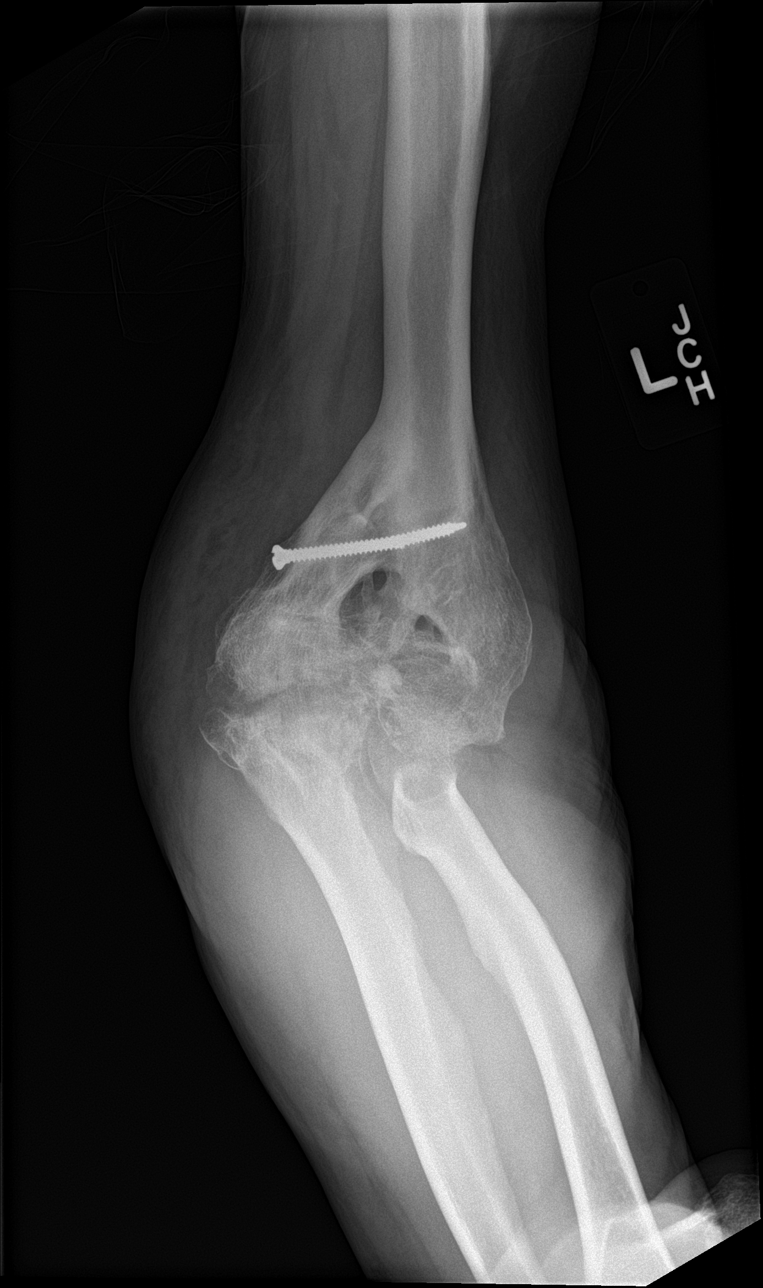

[elbow obl (2 of 2)]
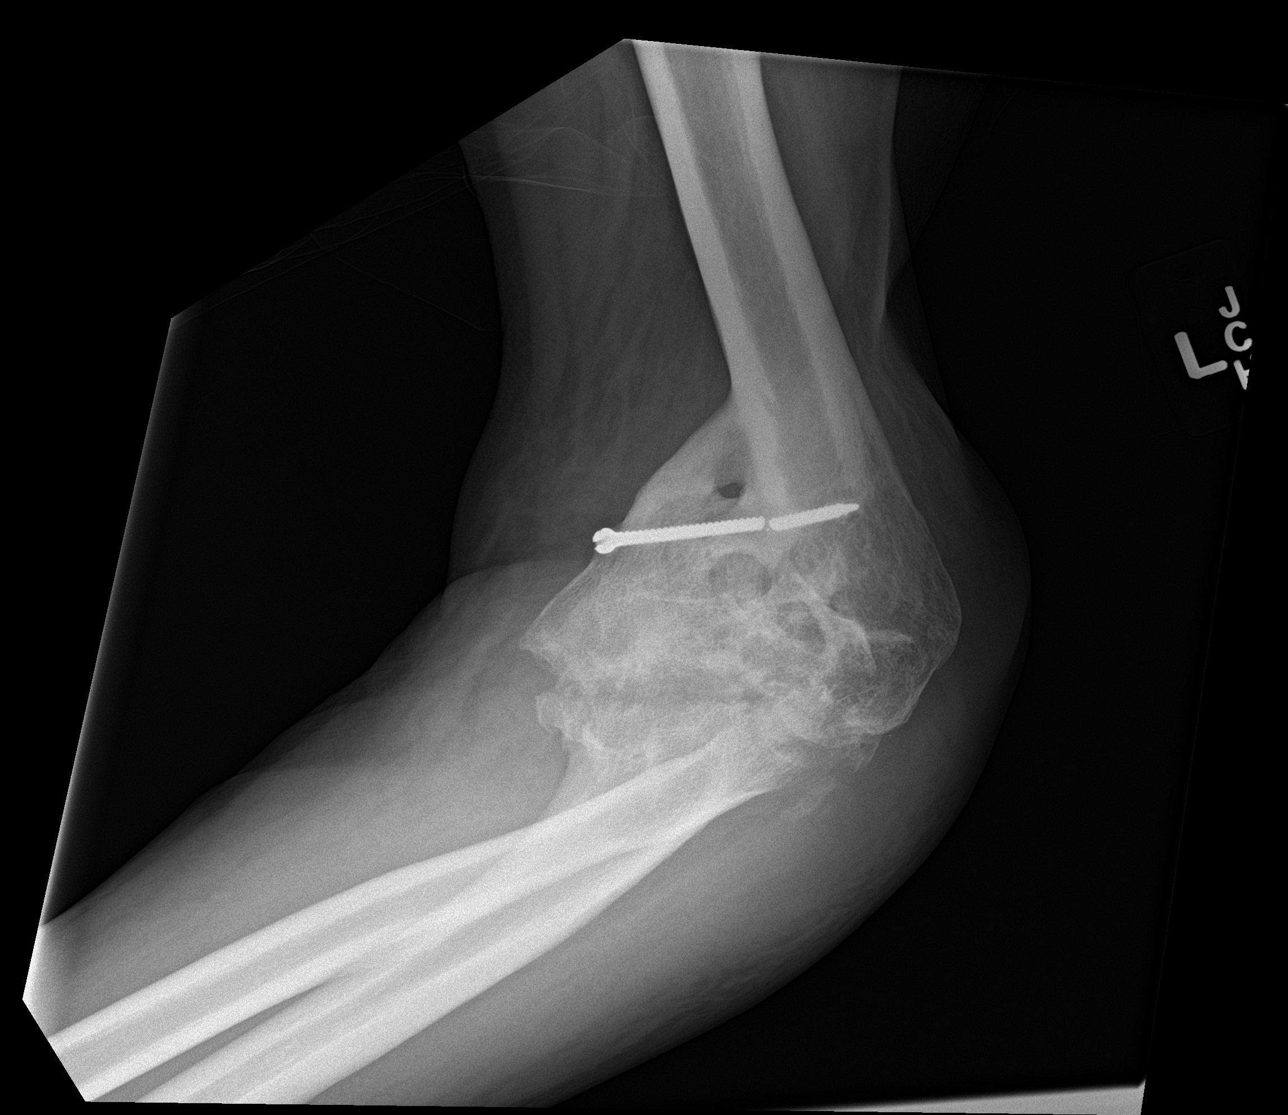

[elbow lat]
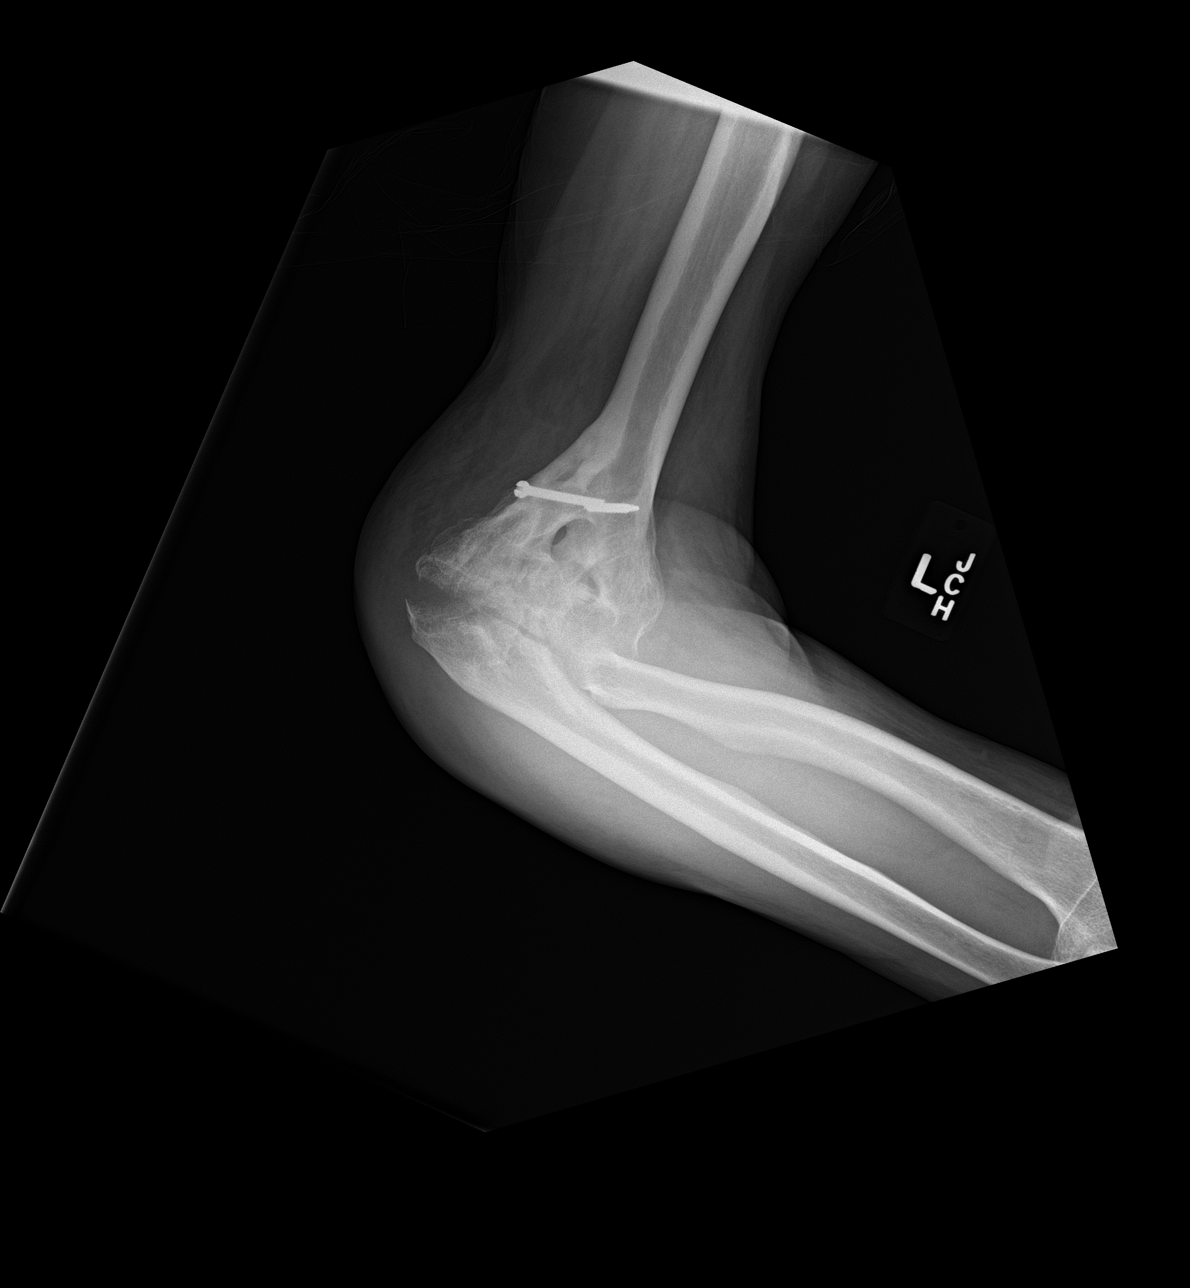

[4 of 4 positions shown; findings below may reference images not displayed]

FINDINGS: Severe deformity of distal humerus is noted with broken fixation
screw present consistent with old fracture. However, there appears
to be a new moderately displaced fracture involving the olecranon.
It appears to be closed and post traumatic. It appears to be
comminuted with overlying soft tissue swelling.
IMPRESSION: Deformity of distal left humerus consistent with old surgically
treated fracture. New moderately displaced fracture is seen
involving the olecranon.

## 2015-04-20 DIAGNOSIS — Z8781 Personal history of (healed) traumatic fracture: Secondary | ICD-10-CM | POA: Diagnosis not present

## 2015-04-20 DIAGNOSIS — S42402P Unspecified fracture of lower end of left humerus, subsequent encounter for fracture with malunion: Secondary | ICD-10-CM | POA: Diagnosis not present

## 2015-06-14 DIAGNOSIS — L0390 Cellulitis, unspecified: Secondary | ICD-10-CM | POA: Diagnosis not present

## 2015-07-06 DIAGNOSIS — Z125 Encounter for screening for malignant neoplasm of prostate: Secondary | ICD-10-CM | POA: Diagnosis not present

## 2015-07-06 DIAGNOSIS — I10 Essential (primary) hypertension: Secondary | ICD-10-CM | POA: Diagnosis not present

## 2015-07-06 DIAGNOSIS — E7800 Pure hypercholesterolemia, unspecified: Secondary | ICD-10-CM | POA: Diagnosis not present

## 2015-07-12 DIAGNOSIS — M71332 Other bursal cyst, left wrist: Secondary | ICD-10-CM | POA: Diagnosis not present

## 2015-07-12 DIAGNOSIS — I10 Essential (primary) hypertension: Secondary | ICD-10-CM | POA: Diagnosis not present

## 2015-07-12 DIAGNOSIS — Z1212 Encounter for screening for malignant neoplasm of rectum: Secondary | ICD-10-CM | POA: Diagnosis not present

## 2015-07-12 DIAGNOSIS — Z7982 Long term (current) use of aspirin: Secondary | ICD-10-CM | POA: Diagnosis not present

## 2015-07-12 DIAGNOSIS — E7800 Pure hypercholesterolemia, unspecified: Secondary | ICD-10-CM | POA: Diagnosis not present

## 2015-07-12 DIAGNOSIS — L570 Actinic keratosis: Secondary | ICD-10-CM | POA: Diagnosis not present

## 2016-01-09 DIAGNOSIS — Z1211 Encounter for screening for malignant neoplasm of colon: Secondary | ICD-10-CM | POA: Diagnosis not present

## 2016-01-09 DIAGNOSIS — Z1212 Encounter for screening for malignant neoplasm of rectum: Secondary | ICD-10-CM | POA: Diagnosis not present

## 2016-03-05 DIAGNOSIS — R0982 Postnasal drip: Secondary | ICD-10-CM | POA: Diagnosis not present

## 2016-03-05 DIAGNOSIS — I10 Essential (primary) hypertension: Secondary | ICD-10-CM | POA: Diagnosis not present

## 2016-03-05 DIAGNOSIS — R05 Cough: Secondary | ICD-10-CM | POA: Diagnosis not present

## 2016-03-06 ENCOUNTER — Other Ambulatory Visit (HOSPITAL_COMMUNITY): Payer: Self-pay | Admitting: Respiratory Therapy

## 2016-03-06 DIAGNOSIS — R059 Cough, unspecified: Secondary | ICD-10-CM

## 2016-03-06 DIAGNOSIS — R05 Cough: Secondary | ICD-10-CM

## 2016-04-04 ENCOUNTER — Ambulatory Visit (HOSPITAL_COMMUNITY)
Admission: RE | Admit: 2016-04-04 | Discharge: 2016-04-04 | Disposition: A | Discharge status: Home / Self Care | Payer: Medicare Other | Source: Ambulatory Visit | Attending: Internal Medicine | Admitting: Internal Medicine

## 2016-04-04 DIAGNOSIS — R05 Cough: Secondary | ICD-10-CM | POA: Diagnosis not present

## 2016-04-04 DIAGNOSIS — R059 Cough, unspecified: Secondary | ICD-10-CM

## 2016-04-04 LAB — SPIROMETRY WITH GRAPH
FEF 25-75 Pre: 1.19 L/sec
FEF2575-%Pred-Pre: 50 %
FEV1-%Pred-Pre: 56 %
FEV1-Pre: 1.85 L
FEV1FVC-%Pred-Pre: 65 %
FEV6-%Pred-Pre: 91 %
FEV6-Pre: 3.89 L
FEV6FVC-%Pred-Pre: 105 %
FVC-%Pred-Pre: 86 %
FVC-Pre: 3.94 L
Pre FEV1/FVC ratio: 47 %
Pre FEV6/FVC Ratio: 99 %

## 2016-04-08 DIAGNOSIS — I10 Essential (primary) hypertension: Secondary | ICD-10-CM | POA: Diagnosis not present

## 2016-04-08 DIAGNOSIS — J449 Chronic obstructive pulmonary disease, unspecified: Secondary | ICD-10-CM | POA: Diagnosis not present

## 2016-04-08 DIAGNOSIS — R0982 Postnasal drip: Secondary | ICD-10-CM | POA: Diagnosis not present

## 2016-04-08 DIAGNOSIS — Z79899 Other long term (current) drug therapy: Secondary | ICD-10-CM | POA: Diagnosis not present

## 2016-07-18 DIAGNOSIS — Z87820 Personal history of traumatic brain injury: Secondary | ICD-10-CM | POA: Diagnosis not present

## 2016-07-18 DIAGNOSIS — M545 Low back pain: Secondary | ICD-10-CM | POA: Diagnosis not present

## 2016-07-18 DIAGNOSIS — Z23 Encounter for immunization: Secondary | ICD-10-CM | POA: Diagnosis not present

## 2016-07-18 DIAGNOSIS — I10 Essential (primary) hypertension: Secondary | ICD-10-CM | POA: Diagnosis not present

## 2016-07-18 DIAGNOSIS — Z79899 Other long term (current) drug therapy: Secondary | ICD-10-CM | POA: Diagnosis not present

## 2016-07-18 DIAGNOSIS — M109 Gout, unspecified: Secondary | ICD-10-CM | POA: Diagnosis not present

## 2016-07-18 DIAGNOSIS — E7800 Pure hypercholesterolemia, unspecified: Secondary | ICD-10-CM | POA: Diagnosis not present

## 2016-07-18 DIAGNOSIS — F419 Anxiety disorder, unspecified: Secondary | ICD-10-CM | POA: Diagnosis not present

## 2016-07-18 DIAGNOSIS — Z125 Encounter for screening for malignant neoplasm of prostate: Secondary | ICD-10-CM | POA: Diagnosis not present

## 2017-09-18 DIAGNOSIS — E7800 Pure hypercholesterolemia, unspecified: Secondary | ICD-10-CM | POA: Diagnosis not present

## 2017-09-18 DIAGNOSIS — Z125 Encounter for screening for malignant neoplasm of prostate: Secondary | ICD-10-CM | POA: Diagnosis not present

## 2017-09-18 DIAGNOSIS — Z7982 Long term (current) use of aspirin: Secondary | ICD-10-CM | POA: Diagnosis not present

## 2017-09-18 DIAGNOSIS — I10 Essential (primary) hypertension: Secondary | ICD-10-CM | POA: Diagnosis not present

## 2017-09-25 DIAGNOSIS — I10 Essential (primary) hypertension: Secondary | ICD-10-CM | POA: Diagnosis not present

## 2017-09-25 DIAGNOSIS — Z0000 Encounter for general adult medical examination without abnormal findings: Secondary | ICD-10-CM | POA: Diagnosis not present

## 2017-09-25 DIAGNOSIS — E7800 Pure hypercholesterolemia, unspecified: Secondary | ICD-10-CM | POA: Diagnosis not present

## 2017-09-25 DIAGNOSIS — F329 Major depressive disorder, single episode, unspecified: Secondary | ICD-10-CM | POA: Diagnosis not present

## 2018-02-02 DIAGNOSIS — F321 Major depressive disorder, single episode, moderate: Secondary | ICD-10-CM | POA: Diagnosis not present

## 2018-02-18 ENCOUNTER — Inpatient Hospital Stay (HOSPITAL_COMMUNITY)
Admission: EM | Admit: 2018-02-18 | Discharge: 2018-02-21 | DRG: 069 | Disposition: A | Discharge status: Home / Self Care | Payer: Medicare Other | Attending: Internal Medicine | Admitting: Internal Medicine

## 2018-02-18 ENCOUNTER — Encounter (HOSPITAL_COMMUNITY): Payer: Self-pay

## 2018-02-18 ENCOUNTER — Other Ambulatory Visit: Payer: Self-pay

## 2018-02-18 ENCOUNTER — Emergency Department (HOSPITAL_COMMUNITY): Payer: Medicare Other

## 2018-02-18 DIAGNOSIS — Z87891 Personal history of nicotine dependence: Secondary | ICD-10-CM

## 2018-02-18 DIAGNOSIS — I428 Other cardiomyopathies: Secondary | ICD-10-CM | POA: Diagnosis not present

## 2018-02-18 DIAGNOSIS — F329 Major depressive disorder, single episode, unspecified: Secondary | ICD-10-CM | POA: Diagnosis not present

## 2018-02-18 DIAGNOSIS — R42 Dizziness and giddiness: Secondary | ICD-10-CM | POA: Diagnosis not present

## 2018-02-18 DIAGNOSIS — Z79899 Other long term (current) drug therapy: Secondary | ICD-10-CM

## 2018-02-18 DIAGNOSIS — H9319 Tinnitus, unspecified ear: Secondary | ICD-10-CM | POA: Diagnosis present

## 2018-02-18 DIAGNOSIS — R001 Bradycardia, unspecified: Secondary | ICD-10-CM | POA: Diagnosis present

## 2018-02-18 DIAGNOSIS — E785 Hyperlipidemia, unspecified: Secondary | ICD-10-CM | POA: Diagnosis not present

## 2018-02-18 DIAGNOSIS — I10 Essential (primary) hypertension: Secondary | ICD-10-CM | POA: Diagnosis not present

## 2018-02-18 DIAGNOSIS — E119 Type 2 diabetes mellitus without complications: Secondary | ICD-10-CM | POA: Diagnosis present

## 2018-02-18 DIAGNOSIS — R262 Difficulty in walking, not elsewhere classified: Secondary | ICD-10-CM | POA: Diagnosis not present

## 2018-02-18 DIAGNOSIS — I358 Other nonrheumatic aortic valve disorders: Secondary | ICD-10-CM | POA: Diagnosis present

## 2018-02-18 DIAGNOSIS — I739 Peripheral vascular disease, unspecified: Secondary | ICD-10-CM | POA: Diagnosis present

## 2018-02-18 DIAGNOSIS — H9190 Unspecified hearing loss, unspecified ear: Secondary | ICD-10-CM | POA: Diagnosis present

## 2018-02-18 DIAGNOSIS — Z87820 Personal history of traumatic brain injury: Secondary | ICD-10-CM

## 2018-02-18 DIAGNOSIS — Z79891 Long term (current) use of opiate analgesic: Secondary | ICD-10-CM

## 2018-02-18 DIAGNOSIS — R61 Generalized hyperhidrosis: Secondary | ICD-10-CM | POA: Diagnosis not present

## 2018-02-18 DIAGNOSIS — I2510 Atherosclerotic heart disease of native coronary artery without angina pectoris: Secondary | ICD-10-CM | POA: Diagnosis present

## 2018-02-18 DIAGNOSIS — G459 Transient cerebral ischemic attack, unspecified: Principal | ICD-10-CM | POA: Diagnosis present

## 2018-02-18 DIAGNOSIS — R2681 Unsteadiness on feet: Secondary | ICD-10-CM | POA: Diagnosis present

## 2018-02-18 DIAGNOSIS — M109 Gout, unspecified: Secondary | ICD-10-CM | POA: Diagnosis present

## 2018-02-18 DIAGNOSIS — H532 Diplopia: Secondary | ICD-10-CM | POA: Diagnosis not present

## 2018-02-18 DIAGNOSIS — J80 Acute respiratory distress syndrome: Secondary | ICD-10-CM | POA: Diagnosis not present

## 2018-02-18 DIAGNOSIS — I519 Heart disease, unspecified: Secondary | ICD-10-CM

## 2018-02-18 HISTORY — DX: Major depressive disorder, single episode, unspecified: F32.9

## 2018-02-18 HISTORY — DX: Depression, unspecified: F32.A

## 2018-02-18 HISTORY — DX: Hyperlipidemia, unspecified: E78.5

## 2018-02-18 LAB — BASIC METABOLIC PANEL
Anion gap: 8 (ref 5–15)
BUN: 16 mg/dL (ref 8–23)
CO2: 23 mmol/L (ref 22–32)
Calcium: 8.2 mg/dL — ABNORMAL LOW (ref 8.9–10.3)
Chloride: 105 mmol/L (ref 98–111)
Creatinine, Ser: 1 mg/dL (ref 0.61–1.24)
GFR calc Af Amer: >60 mL/min (ref >60)
GFR calc non Af Amer: >60 mL/min (ref >60)
Glucose, Bld: 101 mg/dL — ABNORMAL HIGH (ref 70–99)
Potassium: 3.6 mmol/L (ref 3.5–5.1)
Sodium: 136 mmol/L (ref 135–145)

## 2018-02-18 LAB — CBC WITH DIFFERENTIAL/PLATELET
Abs Immature Granulocytes: 0.01 10*3/uL (ref 0.00–0.07)
BASOS PCT: 1 %
Basophils Absolute: 0 10*3/uL (ref 0.0–0.1)
Eosinophils Absolute: 0.1 10*3/uL (ref 0.0–0.5)
Eosinophils Relative: 2 %
HCT: 42.7 % (ref 39.0–52.0)
Hemoglobin: 14.4 g/dL (ref 13.0–17.0)
IMMATURE GRANULOCYTES: 0 %
Lymphocytes Relative: 26 %
Lymphs Abs: 1.5 10*3/uL (ref 0.7–4.0)
MCH: 31.1 pg (ref 26.0–34.0)
MCHC: 33.7 g/dL (ref 30.0–36.0)
MCV: 92.2 fL (ref 80.0–100.0)
Monocytes Absolute: 0.6 10*3/uL (ref 0.1–1.0)
Monocytes Relative: 11 %
Neutro Abs: 3.5 10*3/uL (ref 1.7–7.7)
Neutrophils Relative %: 60 %
PLATELETS: 207 10*3/uL (ref 150–400)
RBC: 4.63 MIL/uL (ref 4.22–5.81)
RDW: 12.1 % (ref 11.5–15.5)
WBC: 5.9 10*3/uL (ref 4.0–10.5)
nRBC: 0 % (ref 0.0–0.2)

## 2018-02-18 MED ORDER — ROSUVASTATIN CALCIUM 5 MG PO TABS
5.0000 mg | ORAL_TABLET | Freq: Every day | ORAL | Status: DC
  Administered 2018-02-19 – 2018-02-21 (×2): 5 mg via ORAL
  Filled 2018-02-18 (×3): qty 1

## 2018-02-18 MED ORDER — FOLIC ACID 1 MG PO TABS
1.0000 mg | ORAL_TABLET | Freq: Every day | ORAL | Status: DC
  Administered 2018-02-19 – 2018-02-21 (×2): 1 mg via ORAL
  Filled 2018-02-18 (×2): qty 1

## 2018-02-18 MED ORDER — ACETAMINOPHEN 325 MG PO TABS: 650.0000 mg | ORAL_TABLET | ORAL | Status: DC | PRN

## 2018-02-18 MED ORDER — ALLOPURINOL 100 MG PO TABS
100.0000 mg | ORAL_TABLET | Freq: Every day | ORAL | Status: DC
  Administered 2018-02-19 – 2018-02-21 (×2): 100 mg via ORAL
  Filled 2018-02-18 (×3): qty 1

## 2018-02-18 MED ORDER — ASPIRIN 300 MG RE SUPP: 300.0000 mg | Freq: Every day | RECTAL | Status: DC

## 2018-02-18 MED ORDER — ENOXAPARIN SODIUM 40 MG/0.4ML ~~LOC~~ SOLN
40.0000 mg | Freq: Every day | SUBCUTANEOUS | Status: DC
  Filled 2018-02-18: qty 0.4

## 2018-02-18 MED ORDER — ADULT MULTIVITAMIN W/MINERALS CH
1.0000 | ORAL_TABLET | Freq: Every day | ORAL | Status: DC
  Administered 2018-02-19 – 2018-02-21 (×2): 1 via ORAL
  Filled 2018-02-18 (×2): qty 1

## 2018-02-18 MED ORDER — ACETAMINOPHEN 650 MG RE SUPP: 650.0000 mg | RECTAL | Status: DC | PRN

## 2018-02-18 MED ORDER — STROKE: EARLY STAGES OF RECOVERY BOOK: Freq: Once | Status: DC

## 2018-02-18 MED ORDER — SODIUM CHLORIDE 0.9 % IV SOLN: INTRAVENOUS | Status: AC

## 2018-02-18 MED ORDER — LORAZEPAM 1 MG PO TABS
1.0000 mg | ORAL_TABLET | Freq: Every day | ORAL | Status: DC | PRN
  Administered 2018-02-19 – 2018-02-20 (×2): 1 mg via ORAL
  Filled 2018-02-18 (×2): qty 1

## 2018-02-18 MED ORDER — BUPROPION HCL ER (XL) 300 MG PO TB24
300.0000 mg | ORAL_TABLET | Freq: Every day | ORAL | Status: DC
  Administered 2018-02-19 – 2018-02-21 (×3): 300 mg via ORAL
  Filled 2018-02-18 (×2): qty 2
  Filled 2018-02-18: qty 1

## 2018-02-18 MED ORDER — LOSARTAN POTASSIUM 50 MG PO TABS
100.0000 mg | ORAL_TABLET | Freq: Every day | ORAL | Status: DC
  Administered 2018-02-19 – 2018-02-21 (×3): 100 mg via ORAL
  Filled 2018-02-18 (×3): qty 2

## 2018-02-18 MED ORDER — ASPIRIN 325 MG PO TABS: 325.0000 mg | ORAL_TABLET | Freq: Every day | ORAL | Status: DC

## 2018-02-18 MED ORDER — AMLODIPINE BESYLATE 2.5 MG PO TABS
2.5000 mg | ORAL_TABLET | Freq: Every day | ORAL | Status: DC
  Administered 2018-02-19 – 2018-02-21 (×3): 2.5 mg via ORAL
  Filled 2018-02-18 (×3): qty 1

## 2018-02-18 MED ORDER — ACETAMINOPHEN 160 MG/5ML PO SOLN
650.0000 mg | ORAL | Status: DC | PRN
  Filled 2018-02-18: qty 20.3

## 2018-02-18 NOTE — ED Provider Notes (Signed)
[facility] EMERGENCY DEPARTMENT Provider Note   CSN: [id number] Arrival date & time: 02/18/18  1844     History   Chief Complaint Chief Complaint  Patient presents with  . Visual Field Change    HPI Edwin Randall is a 78 y.o. male.  HPI   78 year old male with diplopia and ataxia.  First noticed symptoms around 12 noon today.  He was standing in his kitchen when he felt off balance.  He felt like he could not coordinate his movements.  He is reaching for objects he was having difficulty which he describes as "feels like my depth perception was off."  He checked his blood pressure and his machines that his pulse was in the 40s to 50s.  Also reports that he was hypertensive.  In terms of feeling off balance resolved after approximately 30 minutes but he continues to have visual disturbance.  He says he feels like "I am seeing shadows around things."  When asked to describe it further it seems like he is having horizontal diplopia with a fainter mirror image objects.  Past Medical History:  Diagnosis Date  . Depression   . HLD (hyperlipidemia)   . Hypertension     There are no active problems to display for this patient.   Past Surgical History:  Procedure Laterality Date  . arm surgery          Home Medications    Prior to Admission medications   Medication Sig Start Date End Date Taking? Authorizing Provider  oxyCODONE-acetaminophen (PERCOCET/ROXICET) 5-325 MG per tablet Take 1-2 tablets by mouth every 6 (six) hours as needed for severe pain. 02/10/14   Benjiman Core, MD    Family History History reviewed. No pertinent family history.  Social History Social History   Tobacco Use  . Smoking status: Former Games developer  . Smokeless tobacco: Never Used  Substance Use Topics  . Alcohol use: No  . Drug use: No     Allergies   Demerol [meperidine]   Review of Systems Review of Systems   All systems reviewed and negative, other than as  noted in HPI.  Physical Exam Updated Vital Signs BP (!) 186/91   Pulse 77   Temp 98 F (36.7 C) (Oral)   Resp 19   Ht 5\' 11"  (1.803 m)   Wt 88.5 kg   SpO2 99%   BMI 27.20 kg/m   Physical Exam Vitals signs and nursing note reviewed.  Constitutional:      General: He is not in acute distress.    Appearance: He is well-developed.  HENT:     Head: Normocephalic and atraumatic.  Eyes:     General:        Right eye: No discharge.        Left eye: No discharge.     Conjunctiva/sclera: Conjunctivae normal.  Neck:     Musculoskeletal: Neck supple.  Cardiovascular:     Rate and Rhythm: Normal rate and regular rhythm.     Heart sounds: Normal heart sounds. No murmur. No friction rub. No gallop.   Pulmonary:     Effort: Pulmonary effort is normal. No respiratory distress.     Breath sounds: Normal breath sounds.  Abdominal:     General: There is no distension.     Palpations: Abdomen is soft.     Tenderness: There is no abdominal tenderness.  Musculoskeletal:        General: No tenderness.  Skin:  General: Skin is warm and dry.  Neurological:     Mental Status: He is alert.     Comments: Speech is clear content appropriate.  Following commands.  Hard of hearing.  Smile symmetric.  Tongue is midline.  Extraocular muscle function is normal.  He reports double vision with monocular vision in either eye and no change with both eyes. Strength 5/5 b/l u/l extremities. Good finger to nose on R. 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 Able to sit unassisted. Able to stand with feet together with eyes closed with no assistance.       ED Treatments / Results  Labs (all labs ordered are listed, but only abnormal results are displayed) Labs Reviewed  BASIC METABOLIC PANEL - Abnormal; Notable for the following components:      Result Value   Glucose, Bld 101 (*)    Calcium 8.2 (*)    All other components within normal limits  CBC WITH DIFFERENTIAL/PLATELET  CBC WITH  DIFFERENTIAL/PLATELET    EKG EKG Interpretation  Date/Time:  Wednesday February 18 2018 18:50:49 EST Ventricular Rate:  67 PR Interval:    QRS Duration: 89 QT Interval:  392 QTC Calculation: 414 R Axis:   23 Text Interpretation:  Sinus rhythm Atrial premature complex Baseline wander in lead(s) V2 Confirmed by Raeford Razor [phone]) on 02/18/2018 6:59:20 PM   Radiology Ct Head Wo Contrast  Result Date: 02/18/2018 CLINICAL DATA:  Diplopia. EXAM: CT HEAD WITHOUT CONTRAST TECHNIQUE: Contiguous axial images were obtained from the base of the skull through the vertex without intravenous contrast. COMPARISON:  Head CT 06/12/2007 FINDINGS: Brain: Age related atrophy. Mild to moderate chronic small vessel ischemia. No intracranial hemorrhage, mass effect, or midline shift. No hydrocephalus. The basilar cisterns are patent. No evidence of territorial infarct or acute ischemia. No extra-axial or intracranial fluid collection. Vascular: Atherosclerosis of skullbase vasculature without hyperdense vessel or abnormal calcification. Skull: No fracture or focal lesion. Sinuses/Orbits: Resolve mucosal thickening from prior exam. Paranasal sinuses and mastoid air cells are clear. Visualized orbits are unremarkable. Other: None. IMPRESSION: 1. No acute intracranial abnormality. 2. Atrophy and chronic small vessel ischemia. Electronically Signed   By: Narda Rutherford M.D.   On: 02/18/2018 20:28   Mr Brain Wo Contrast  Result Date: 02/18/2018 CLINICAL DATA:  78 y/o M; Diplopia and ataxia now resolved with persistent horizontal diplopia. EXAM: MRI HEAD WITHOUT CONTRAST TECHNIQUE: Multiplanar, multiecho pulse sequences of the brain and surrounding structures were obtained without intravenous contrast. COMPARISON:  02/18/2017 CT head. FINDINGS: Brain: No acute infarction, hemorrhage, hydrocephalus, extra-axial collection or mass lesion. Few punctate nonspecific T2 FLAIR hyperintensities in subcortical and periventricular  white matter are compatible with mild chronic microvascular ischemic changes for age. Mild volume loss of the brain. Vascular: Normal flow voids. Skull and upper cervical spine: Normal marrow signal. Sinuses/Orbits: Mild mucosal thickening of the bilateral maxillary sinus alveolar recesses. No abnormal signal of the additional visible paranasal sinuses or the mastoid air cells. Orbits are unremarkable. Other: None. IMPRESSION: 1. No acute intracranial abnormality identified. 2. Mild for age chronic microvascular ischemic changes and volume loss of the brain. Electronically Signed   By: Mitzi Hansen M.D.   On: 02/18/2018 22:50    Procedures Procedures (including critical care time)  Medications Ordered in ED Medications - No data to display   Initial Impression / Assessment and Plan / ED Course  I have reviewed the triage vital signs and the nursing notes.  Pertinent labs & imaging results  that were available during my care of the patient were reviewed by me and considered in my medical decision making (see chart for details).     77yM with ataxia and diplopia. Ataxia resolved but reports persistent horizontal diplopia. Wife concerned about episode of bradycardia at home. I'm not convinced contributory. Occasional PACs and sinus arrythmia. Sometimes HR into high 40s while on tele in the ED. No new symptoms with this though. Says diplopia persisting even when HR in 70-80s. Diplopia doesn't resolve with closing of either eye. CT and MRI negative. Pt has been under a tremendous amount of stress. In the past year his sone died and daughter paralyzed. Began treatment for depression by PCP recently. May be component of anxiety/depression.   Final Clinical Impressions(s) / ED Diagnoses   Final diagnoses:  Diplopia    ED Discharge Orders    None       Raeford Razor, MD 02/26/18 [phone]

## 2018-02-18 NOTE — ED Triage Notes (Addendum)
Pt BIB [facility] for eval of blurry vision. Wife reports that he became "tippy" w/ altered hand-eye coordination and that his peripheral vision was "off". Pt reports he noted his HR was in the 40s. Pt went to PCP this afternoon and was sent here for r/o TIA d/t visual changes. Pt reports vision "feels as though it has a shadow around it". GCS 15, no focal neuro deficits. EMS reports +ataxia. Stroke screen negative. LKN 1200.

## 2018-02-18 NOTE — ED Notes (Signed)
Patient transported to CT 

## 2018-02-18 NOTE — H&P (Signed)
History and Physical    Edwin Randall JFH:[id number] DOB: 02-24-1940 DOA: 02/18/2018  PCP: Merri Brunette, MD  Patient coming from: Home.  Chief Complaint: Difficulty walking and visual symptoms.  HPI: Edwin Randall is a 78 y.o. male with history of hypertension, hyperlipidemia, depression presents to the ER with complaints of having sudden onset of symptoms of walking at around noon time.  Symptoms lasted for half hour.  The same time patient also had visual symptoms which were like every object had a shadow along with it.  Patient disequilibrium resolved by half hour but the visual symptoms persisted.  Patient went to his primary care physician Dr. Renne Crigler who referred patient to the ER.  Patient denies any weakness of the upper or lower extremities or any difficulty speaking or swallowing.  Patient states during his symptoms he checked his heart rate and it was around 40 bpm which gradually improved in the next hour.  At the PCPs office it was normal.  ED Course: In the ER MRI brain and CT head was unremarkable.  Neurologist on-call was consulted and at this time admitted for possible TIA.  EKG was showing sinus arrhythmia.  Review of Systems: As per HPI, rest all negative.   Past Medical History:  Diagnosis Date  . Depression   . HLD (hyperlipidemia)   . Hypertension     Past Surgical History:  Procedure Laterality Date  . arm surgery       reports that he has quit smoking. He has never used smokeless tobacco. He reports that he does not drink alcohol or use drugs.  Allergies  Allergen Reactions  . Demerol [Meperidine] Nausea And Vomiting    Family History - father had CAD.  Prior to Admission medications   Medication Sig Start Date End Date Taking? Authorizing Provider  allopurinol (ZYLOPRIM) 100 MG tablet Take 100 mg by mouth daily. 12/01/14  Yes [provider]  amLODipine (NORVASC) 5 MG tablet Take 2.5-5 mg by mouth daily.   Yes [provider]    buPROPion (WELLBUTRIN XL) 150 MG 24 hr tablet Take 300 mg by mouth daily.   Yes [provider]  folic acid (FOLVITE) 1 MG tablet Take 1 mg by mouth daily. 11/29/14  Yes [provider]  ketoconazole (NIZORAL) 2 % shampoo Apply 1 application topically daily as needed for dry skin. 11/14/14  Yes [provider]  LORazepam (ATIVAN) 1 MG tablet Take 1 mg by mouth daily as needed for anxiety. 01/18/15  Yes [provider]  losartan (COZAAR) 100 MG tablet Take 100 mg by mouth daily. 12/13/14  Yes [provider]  Multiple Vitamin (MULTIVITAMIN WITH MINERALS) TABS tablet Take 1 tablet by mouth daily.   Yes [provider]  rosuvastatin (CRESTOR) 10 MG tablet Take 5 mg by mouth daily. 11/29/14  Yes [provider]  oxyCODONE-acetaminophen (PERCOCET/ROXICET) 5-325 MG per tablet Take 1-2 tablets by mouth every 6 (six) hours as needed for severe pain. Patient not taking: Reported on 02/18/2018 02/10/14   Benjiman Core, MD    Physical Exam: Vitals:   02/18/18 2100 02/18/18 2115 02/18/18 2130 02/18/18 2145  BP: (!) 171/87 (!) 165/80 (!) 173/91 (!) 162/84  Pulse: 71 73 83 71  Resp: (!) 26   (!) 26  Temp:      TempSrc:      SpO2: 96% 97% 98% 97%  Weight:      Height:          Constitutional:  Moderately built and nourished. Vitals:   02/18/18 2100 02/18/18 2115 02/18/18 2130 02/18/18 2145  BP: (!) 171/87 (!) 165/80 (!) 173/91 (!) 162/84  Pulse: 71 73 83 71  Resp: (!) 26   (!) 26  Temp:      TempSrc:      SpO2: 96% 97% 98% 97%  Weight:      Height:       Eyes: Anicteric no pallor. ENMT: No discharge from the ears eyes nose or mouth. Neck: No mass felt.  No neck rigidity.  No JVD appreciated. Respiratory: No rhonchi or crepitations. Cardiovascular: S1-S2 heard. Abdomen: Soft nontender bowel sounds present. Musculoskeletal: No edema.  No joint effusion. Skin: No rash. Neurologic: Alert awake oriented to time place and person.   Moves all extremities 5 x 5.  No facial asymmetry tongue is midline.  Pupils are equal and reacting to light. Psychiatric: Appears normal.   Labs on Admission: I have personally reviewed following labs and imaging studies  CBC: Recent Labs  Lab 02/18/18 2036  WBC 5.9  NEUTROABS 3.5  HGB 14.4  HCT 42.7  MCV 92.2  PLT 207   Basic Metabolic Panel: Recent Labs  Lab 02/18/18 1940  NA 136  K 3.6  CL 105  CO2 23  GLUCOSE 101*  BUN 16  CREATININE 1.00  CALCIUM 8.2*   GFR: Estimated Creatinine Clearance: 64.8 mL/min (by C-G formula based on SCr of 1 mg/dL). Liver Function Tests: No results for input(s): AST, ALT, ALKPHOS, BILITOT, PROT, ALBUMIN in the last 168 hours. No results for input(s): LIPASE, AMYLASE in the last 168 hours. No results for input(s): AMMONIA in the last 168 hours. Coagulation Profile: No results for input(s): INR, PROTIME in the last 168 hours. Cardiac Enzymes: No results for input(s): CKTOTAL, CKMB, CKMBINDEX, TROPONINI in the last 168 hours. BNP (last 3 results) No results for input(s): PROBNP in the last 8760 hours. HbA1C: No results for input(s): HGBA1C in the last 72 hours. CBG: No results for input(s): GLUCAP in the last 168 hours. Lipid Profile: No results for input(s): CHOL, HDL, LDLCALC, TRIG, CHOLHDL, LDLDIRECT in the last 72 hours. Thyroid Function Tests: No results for input(s): TSH, T4TOTAL, FREET4, T3FREE, THYROIDAB in the last 72 hours. Anemia Panel: No results for input(s): VITAMINB12, FOLATE, FERRITIN, TIBC, IRON, RETICCTPCT in the last 72 hours. Urine analysis: No results found for: COLORURINE, APPEARANCEUR, LABSPEC, PHURINE, GLUCOSEU, HGBUR, BILIRUBINUR, KETONESUR, PROTEINUR, UROBILINOGEN, NITRITE, LEUKOCYTESUR Sepsis Labs: @LABRCNTIP (procalcitonin:4,lacticidven:4) )No results found for this or any previous visit (from the past 240 hour(s)).   Radiological Exams on Admission: Ct Head Wo Contrast  Result Date:  02/18/2018 CLINICAL DATA:  Diplopia. EXAM: CT HEAD WITHOUT CONTRAST TECHNIQUE: Contiguous axial images were obtained from the base of the skull through the vertex without intravenous contrast. COMPARISON:  Head CT 06/12/2007 FINDINGS: Brain: Age related atrophy. Mild to moderate chronic small vessel ischemia. No intracranial hemorrhage, mass effect, or midline shift. No hydrocephalus. The basilar cisterns are patent. No evidence of territorial infarct or acute ischemia. No extra-axial or intracranial fluid collection. Vascular: Atherosclerosis of skullbase vasculature without hyperdense vessel or abnormal calcification. Skull: No fracture or focal lesion. Sinuses/Orbits: Resolve mucosal thickening from prior exam. Paranasal sinuses and mastoid air cells are clear. Visualized orbits are unremarkable. Other: None. IMPRESSION: 1. No acute intracranial abnormality. 2. Atrophy and chronic small vessel ischemia. Electronically Signed   By: Narda Rutherford M.D.   On: 02/18/2018 20:28   Mr Brain Wo Contrast  Result Date: 02/18/2018  CLINICAL DATA:  78 y/o M; Diplopia and ataxia now resolved with persistent horizontal diplopia. EXAM: MRI HEAD WITHOUT CONTRAST TECHNIQUE: Multiplanar, multiecho pulse sequences of the brain and surrounding structures were obtained without intravenous contrast. COMPARISON:  02/18/2017 CT head. FINDINGS: Brain: No acute infarction, hemorrhage, hydrocephalus, extra-axial collection or mass lesion. Few punctate nonspecific T2 FLAIR hyperintensities in subcortical and periventricular white matter are compatible with mild chronic microvascular ischemic changes for age. Mild volume loss of the brain. Vascular: Normal flow voids. Skull and upper cervical spine: Normal marrow signal. Sinuses/Orbits: Mild mucosal thickening of the bilateral maxillary sinus alveolar recesses. No abnormal signal of the additional visible paranasal sinuses or the mastoid air cells. Orbits are unremarkable. Other: None.  IMPRESSION: 1. No acute intracranial abnormality identified. 2. Mild for age chronic microvascular ischemic changes and volume loss of the brain. Electronically Signed   By: Mitzi Hansen M.D.   On: 02/18/2018 22:50    EKG: Independently reviewed.  Sinus arrhythmia.  Assessment/Plan Principal Problem:   Diplopia Active Problems:   Essential hypertension    1. TIA -appreciate neurology consult.  Patient has been placed on Plavix aspirin.  Was given a Plavix loading dose.  Check hemoglobin A1c lipid panel MRA brain 2D echo carotid Doppler.  Neurochecks.  Patient passed swallow. 2. Hypertension allow for permissive hypertension patient is on ARB and amlodipine. 3. Hyperlipidemia on Crestor. 4. Depression on Wellbutrin. 5. History of gout on allopurinol.   DVT prophylaxis: Lovenox. Code Status: Full code. Family Communication: Patient's wife. Disposition Plan: Home. Consults called: Neurology. Admission status: Observation.   Eduard Clos MD [facility] Pager [phone].  If 7PM-7AM, please contact night-coverage [email or web address] Password [email or web address]  02/18/2018, 11:45 PM

## 2018-02-18 NOTE — ED Notes (Signed)
Patient transported to MRI 

## 2018-02-18 NOTE — ED Notes (Signed)
ED Provider at bedside. 

## 2018-02-18 NOTE — ED Notes (Signed)
Neurologist Kirkpatrick at bedside 

## 2018-02-19 ENCOUNTER — Observation Stay (HOSPITAL_COMMUNITY): Payer: Medicare Other

## 2018-02-19 ENCOUNTER — Other Ambulatory Visit: Payer: Self-pay | Admitting: Physician Assistant

## 2018-02-19 ENCOUNTER — Observation Stay (HOSPITAL_BASED_OUTPATIENT_CLINIC_OR_DEPARTMENT_OTHER): Payer: Medicare Other

## 2018-02-19 ENCOUNTER — Encounter (HOSPITAL_COMMUNITY): Payer: Self-pay | Admitting: Internal Medicine

## 2018-02-19 ENCOUNTER — Other Ambulatory Visit: Payer: Self-pay

## 2018-02-19 DIAGNOSIS — I370 Nonrheumatic pulmonary valve stenosis: Secondary | ICD-10-CM

## 2018-02-19 DIAGNOSIS — R001 Bradycardia, unspecified: Secondary | ICD-10-CM | POA: Diagnosis not present

## 2018-02-19 DIAGNOSIS — G459 Transient cerebral ischemic attack, unspecified: Secondary | ICD-10-CM

## 2018-02-19 DIAGNOSIS — R2681 Unsteadiness on feet: Secondary | ICD-10-CM

## 2018-02-19 DIAGNOSIS — I5021 Acute systolic (congestive) heart failure: Secondary | ICD-10-CM

## 2018-02-19 DIAGNOSIS — I10 Essential (primary) hypertension: Secondary | ICD-10-CM | POA: Diagnosis not present

## 2018-02-19 DIAGNOSIS — E785 Hyperlipidemia, unspecified: Secondary | ICD-10-CM

## 2018-02-19 DIAGNOSIS — H532 Diplopia: Secondary | ICD-10-CM | POA: Diagnosis not present

## 2018-02-19 DIAGNOSIS — M109 Gout, unspecified: Secondary | ICD-10-CM | POA: Diagnosis present

## 2018-02-19 DIAGNOSIS — I361 Nonrheumatic tricuspid (valve) insufficiency: Secondary | ICD-10-CM

## 2018-02-19 LAB — HEMOGLOBIN A1C
Hgb A1c MFr Bld: 5.6 % (ref 4.8–5.6)
Mean Plasma Glucose: 114.02 mg/dL

## 2018-02-19 LAB — CBC
HCT: 42 % (ref 39.0–52.0)
Hemoglobin: 14.6 g/dL (ref 13.0–17.0)
MCH: 32.2 pg (ref 26.0–34.0)
MCHC: 34.8 g/dL (ref 30.0–36.0)
MCV: 92.5 fL (ref 80.0–100.0)
Platelets: 223 10*3/uL (ref 150–400)
RBC: 4.54 MIL/uL (ref 4.22–5.81)
RDW: 12 % (ref 11.5–15.5)
WBC: 6.3 10*3/uL (ref 4.0–10.5)
nRBC: 0 % (ref 0.0–0.2)

## 2018-02-19 LAB — LIPID PANEL
CHOL/HDL RATIO: 2.7 ratio
Cholesterol: 136 mg/dL (ref 0–200)
HDL: 51 mg/dL (ref >40)
LDL Cholesterol: 63 mg/dL (ref 0–99)
Triglycerides: 108 mg/dL (ref <150)
VLDL: 22 mg/dL (ref 0–40)

## 2018-02-19 LAB — CREATININE, SERUM
Creatinine, Ser: 0.99 mg/dL (ref 0.61–1.24)
GFR calc Af Amer: >60 mL/min (ref >60)
GFR calc non Af Amer: >60 mL/min (ref >60)

## 2018-02-19 LAB — ECHOCARDIOGRAM COMPLETE
Height: 71 in
Weight: 3120 oz

## 2018-02-19 MED ORDER — CLOPIDOGREL BISULFATE 300 MG PO TABS
300.0000 mg | ORAL_TABLET | Freq: Once | ORAL | Status: AC
  Administered 2018-02-19: 300 mg via ORAL
  Filled 2018-02-19: qty 1

## 2018-02-19 MED ORDER — CLOPIDOGREL BISULFATE 75 MG PO TABS
75.0000 mg | ORAL_TABLET | Freq: Every day | ORAL | Status: DC
  Administered 2018-02-20 – 2018-02-21 (×2): 75 mg via ORAL
  Filled 2018-02-19 (×2): qty 1

## 2018-02-19 MED ORDER — ASPIRIN EC 81 MG PO TBEC
81.0000 mg | DELAYED_RELEASE_TABLET | Freq: Every day | ORAL | Status: DC
  Administered 2018-02-19 – 2018-02-21 (×3): 81 mg via ORAL
  Filled 2018-02-19 (×3): qty 1

## 2018-02-19 NOTE — Progress Notes (Signed)
Cardiology spoke with patient at 1900. Spoke with patient and patient made nurse aware he wants to have Cath tomorrow recommended by cardiology. Cardiology Dr. Rennis Golden paged and made aware. Pt kept NPO per provider order incase he wanted to have procedure done.

## 2018-02-19 NOTE — Care Management Obs Status (Signed)
MEDICARE OBSERVATION STATUS NOTIFICATION   Patient Details  Name: Edwin Randall MRN: [id number] Date of Birth: 10-14-1940   Medicare Observation Status Notification Given:  Yes    Antony Haste, RN 02/19/2018, 8:39 PM

## 2018-02-19 NOTE — Plan of Care (Signed)

## 2018-02-19 NOTE — Progress Notes (Signed)
VASCULAR LAB PRELIMINARY  PRELIMINARY  PRELIMINARY  PRELIMINARY  Carotid duplex completed.    Preliminary report:  1-39% ICA plaquing. Vertebral artery flow is antegrade.   Parthiv Mucci, RVT 02/19/2018, 11:45 AM

## 2018-02-19 NOTE — ED Notes (Signed)
Patient ambulatory to bathroom with steady gait at this time 

## 2018-02-19 NOTE — Evaluation (Addendum)
Occupational Therapy Evaluation Patient Details Name: Edwin Randall MRN: [id number] DOB: [birth year] Today's Date: 02/19/2018    History of Present Illness Pt admitted 02/18/18 with transient disequilibrium and vision changes with bradycardia which improved throughout the day. MRI negative for stroke. PMH: depression, HTN.   Clinical Impression   Pt is functioning independently, but reports he does not feel he has returned completely to his baseline. No OT needs. Signing off.    Follow Up Recommendations  No OT follow up    Equipment Recommendations  None recommended by OT    Recommendations for Other Services       Precautions / Restrictions Precautions Precautions: None      Mobility Bed Mobility               General bed mobility comments: pt returning from bathroom upon arrival  Transfers Overall transfer level: Independent Equipment used: None                  Balance Overall balance assessment: Mild deficits observed, not formally tested                                         ADL either performed or assessed with clinical judgement   ADL Overall ADL's : Independent                                             Vision Baseline Vision/History: Wears glasses Wears Glasses: Reading only Patient Visual Report: No change from baseline       Perception     Praxis      Pertinent Vitals/Pain Pain Assessment: No/denies pain     Hand Dominance Left   Extremity/Trunk Assessment Upper Extremity Assessment Upper Extremity Assessment: RUE deficits/detail;LUE deficits/detail RUE Deficits / Details: h/o shoulder surgery LUE Deficits / Details: h/o injury to elbow resulting in limited extension   Lower Extremity Assessment Lower Extremity Assessment: Defer to PT evaluation   Cervical / Trunk Assessment Cervical / Trunk Assessment: Normal   Communication Communication Communication: No difficulties   Cognition  Arousal/Alertness: Awake/alert Behavior During Therapy: WFL for tasks assessed/performed Overall Cognitive Status: Within Functional Limits for tasks assessed                                     General Comments  per wife, pt's mobility is at his baseline    Exercises     Shoulder Instructions      Home Living Family/patient expects to be discharged to:: Private residence Living Arrangements: Spouse/significant other Available Help at Discharge: Family;Available PRN/intermittently(wife works) Type of Home: House Home Access: Stairs to enter Secretary/administrator of Steps: 2 Entrance Stairs-Rails: Right Home Layout: Two level;Bed/bath upstairs Alternate Level Stairs-Number of Steps: flight Alternate Level Stairs-Rails: Right Bathroom Shower/Tub: Producer, television/film/video: Standard     Home Equipment: None          Prior Functioning/Environment Level of Independence: Independent        Comments: retired        OT Problem List:        OT Treatment/Interventions:      OT Goals(Current goals can be found in the care plan  section) Acute Rehab OT Goals Patient Stated Goal: to return home ASAP  OT Frequency:     Barriers to D/C:            Co-evaluation              AM-PAC OT "6 Clicks" Daily Activity     Outcome Measure Help from another person eating meals?: None Help from another person taking care of personal grooming?: None Help from another person toileting, which includes using toliet, bedpan, or urinal?: None Help from another person bathing (including washing, rinsing, drying)?: None Help from another person to put on and taking off regular upper body clothing?: None Help from another person to put on and taking off regular lower body clothing?: None 6 Click Score: 24   End of Session    Activity Tolerance: Patient tolerated treatment well Patient left: in chair;with call bell/phone within reach;with family/visitor  present  OT Visit Diagnosis: Other abnormalities of gait and mobility (R26.89)                Time: 2878-6767 OT Time Calculation (min): 31 min Charges:  OT General Charges $OT Visit: 1 Visit OT Evaluation $OT Eval Low Complexity: 1 Low  Martie Round, OTR/L Acute Rehabilitation Services Pager: [phone] Office: [phone]  Evern Bio 02/19/2018, 10:45 AM

## 2018-02-19 NOTE — Progress Notes (Signed)
  Echocardiogram 2D Echocardiogram has been performed.  Edwin Randall 02/19/2018, 12:14 PM

## 2018-02-19 NOTE — Consult Note (Signed)
Cardiology Consultation:   Patient ID: JOSHUAJAMES MOEHRING; [id number]; 1941/01/13   Admit date: 02/18/2018 Date of Consult: 02/19/2018  Primary Care Provider: Merri Brunette, MD Primary Cardiologist: No primary care provider on file. New Primary Electrophysiologist:  None   Patient Profile:   CAREEM YASUI is a 78 y.o. male with a hx of HTN, HLD, gout, who is being seen today for the evaluation of decreased EF and bradycardia at the request of Dr Caleb Popp.  History of Present Illness:   Mr. Rewerts was admitted 01/08 with double vision, balance issues>>felt TIA, Neuro seeing.  He had some bradycardia with heart rates in the 40s and his EF was decreased on echo, cardiology asked to see.  At baseline, Mr Schara does not exercise but does things around the house such as using a chainsaw, moving wood around, and is able to go up/down flights of steps, does all these things without difficulty.  He has had chest discomfort at times, but says it is associated w/ cough, not exertional.   He has not had LE edema. Wakes in the night at times, not due to SOB.  No orthopnea.  Denies any DOE, no change in ability to do work.  Has been under stress from prolonged illness and death of his son in 08-28-17.  When he was staying with his son in the hospital, he had cold air blowing on him every night.  He feels this gave him the cough.  It is a dry cough, nonproductive, he has had no fevers or chills.  He denies any presyncope.    No palpitations.   He and his wife feel there were 2 previous episodes of similar sx, did not seek help because sx resolved.    Past Medical History:  Diagnosis Date  . Depression   . HLD (hyperlipidemia)   . Hypertension     Past Surgical History:  Procedure Laterality Date  . ELBOW FRACTURE SURGERY    . ROTATOR CUFF REPAIR       Prior to Admission medications   Medication Sig Start Date End Date Taking? Authorizing Provider  allopurinol (ZYLOPRIM) 100 MG tablet Take 100  mg by mouth daily. 12/01/14  Yes [provider]  amLODipine (NORVASC) 5 MG tablet Take 2.5-5 mg by mouth daily.   Yes [provider]  buPROPion (WELLBUTRIN XL) 150 MG 24 hr tablet Take 300 mg by mouth daily.   Yes [provider]  folic acid (FOLVITE) 1 MG tablet Take 1 mg by mouth daily. 11/29/14  Yes [provider]  ketoconazole (NIZORAL) 2 % shampoo Apply 1 application topically daily as needed for dry skin. 11/14/14  Yes [provider]  LORazepam (ATIVAN) 1 MG tablet Take 1 mg by mouth daily as needed for anxiety. 01/18/15  Yes [provider]  losartan (COZAAR) 100 MG tablet Take 100 mg by mouth daily. 12/13/14  Yes [provider]  Multiple Vitamin (MULTIVITAMIN WITH MINERALS) TABS tablet Take 1 tablet by mouth daily.   Yes [provider]  rosuvastatin (CRESTOR) 10 MG tablet Take 5 mg by mouth daily. 11/29/14  Yes [provider]  oxyCODONE-acetaminophen (PERCOCET/ROXICET) 5-325 MG per tablet Take 1-2 tablets by mouth every 6 (six) hours as needed for severe pain. Patient not taking: Reported on 02/18/2018 02/10/14   Benjiman Core, MD    Inpatient Medications: Scheduled Meds: .  stroke: mapping our early stages of recovery book   Does not apply Once  . allopurinol  100 mg Oral Daily  . amLODipine  2.5 mg Oral Daily  . aspirin EC  81 mg Oral Daily  . buPROPion  300 mg Oral Daily  . [START ON 02/20/2018] clopidogrel  75 mg Oral Daily  . enoxaparin (LOVENOX) injection  40 mg Subcutaneous Daily  . folic acid  1 mg Oral Daily  . losartan  100 mg Oral Daily  . multivitamin with minerals  1 tablet Oral Daily  . rosuvastatin  5 mg Oral Daily   Continuous Infusions: . sodium chloride     PRN Meds: acetaminophen **OR** acetaminophen (TYLENOL) oral liquid 160 mg/5 mL **OR** acetaminophen, LORazepam  Allergies:    Allergies  Allergen Reactions  . Demerol [Meperidine] Nausea And Vomiting    Social  History:   Social History   Socioeconomic History  . Marital status: Married    Spouse name: Not on file  . Number of children: Not on file  . Years of education: Not on file  . Highest education level: Not on file  Occupational History  . Occupation: Retired  Engineer, production  . Financial resource strain: Not on file  . Food insecurity:    Worry: Not on file    Inability: Not on file  . Transportation needs:    Medical: Not on file    Non-medical: Not on file  Tobacco Use  . Smoking status: Former Games developer  . Smokeless tobacco: Never Used  Substance and Sexual Activity  . Alcohol use: No  . Drug use: No  . Sexual activity: Not on file  Lifestyle  . Physical activity:    Days per week: Not on file    Minutes per session: Not on file  . Stress: Not on file  Relationships  . Social connections:    Talks on phone: Not on file    Gets together: Not on file    Attends religious service: Not on file    Active member of club or organization: Not on file    Attends meetings of clubs or organizations: Not on file    Relationship status: Not on file  . Intimate partner violence:    Fear of current or ex partner: Not on file    Emotionally abused: Not on file    Physically abused: Not on file    Forced sexual activity: Not on file  Other Topics Concern  . Not on file  Social History Narrative  . Not on file    Family History:   Family History  Problem Relation Age of Onset  . CAD Father 63  . Unexplained death Mother 38       old age   Family Status:  Family Status  Relation Name Status  . Father  Deceased  . Mother  Deceased  . Son  Deceased    ROS:  Please see the history of present illness.  All other ROS reviewed and negative.     Physical Exam/Data:   Vitals:   02/19/18 0500 02/19/18 0709 02/19/18 1356 02/19/18 1700  BP: (!) 143/64 (!) 167/91 (!) 141/66 (!) 147/79  Pulse: (!) 50 (!) 58 76 71  Resp: 15 19 18    Temp:  97.9 F (36.6 C) 98.5 F (36.9 C)  98.3 F (36.8 C)  TempSrc:  Oral Oral Oral  SpO2: 99% 99% 97% 96%  Weight:      Height:        Intake/Output Summary (Last 24 hours) at 02/19/2018 1908 Last data filed at 02/18/2018  2135 Gross per 24 hour  Intake -  Output 800 ml  Net -800 ml   Filed Weights   02/18/18 1848  Weight: 88.5 kg   Body mass index is 27.2 kg/m.  General:  Well nourished, well developed, in no acute distress HEENT: normal  Lymph: no adenopathy Neck: no JVD Endocrine:  No thryomegaly Vascular: No carotid bruits but murmur radiates into them; 4/4 extremity pulses 2+   Cardiac:  normal S1, S2; RRR; 2/6 murmur  Lungs:  clear to auscultation bilaterally, no wheezing, rhonchi or rales  Abd: soft, nontender, no hepatomegaly  Ext: no edema Musculoskeletal:  No deformities, BUE and BLE strength normal and equal Skin: warm and dry  Neuro:  CNs 2-12 intact, no focal abnormalities noted Psych:  Normal affect   EKG:  The EKG was personally reviewed and demonstrates: 02/18/2018 ECG is sinus rhythm with sinus arrhythmia, heart rate 67, no acute ischemic changes, no pathologic Q waves, normal intervals Telemetry:  Telemetry was personally reviewed and demonstrates: Sinus rhythm, sinus bradycardia into the 40s, PACs that are frequent at times, occasional PVCs.  4 beat run of probable SVT noted  Relevant CV Studies:  ECHO: 02/19/2017 - Left ventricle: The cavity size was moderately dilated. Systolic   function was mildly to moderately reduced. The estimated ejection   fraction was in the range of 40% to 45%. Diffuse hypokinesis.   Doppler parameters are consistent with abnormal left ventricular   relaxation (grade 1 diastolic dysfunction). - Aortic valve: Sclerosis without stenosis. Valve area (VTI): 1.76   cm^2. Valve area (Vmax): 1.46 cm^2. Valve area (Vmean): 1.59   cm^2. - Mitral valve: Calcified annulus. Mildly thickened leaflets .   There was mild regurgitation. Valve area by pressure half-time:   2.44  cm^2. - Left atrium: The atrium was moderately dilated. - Atrial septum: Poorly visualized but appears mobile cannot r/o   PFO.   Laboratory Data:  Chemistry Recent Labs  Lab 02/18/18 1940 02/19/18 0002  NA 136  --   K 3.6  --   CL 105  --   CO2 23  --   GLUCOSE 101*  --   BUN 16  --   CREATININE 1.00 0.99  CALCIUM 8.2*  --   GFRNONAA >60 >60  GFRAA >60 >60  ANIONGAP 8  --     Lab Results  Component Value Date   ALT 38 02/10/2014   AST 41 (H) 02/10/2014   ALKPHOS 57 02/10/2014   BILITOT 0.5 02/10/2014   Hematology Recent Labs  Lab 02/18/18 2036 02/19/18 0002  WBC 5.9 6.3  RBC 4.63 4.54  HGB 14.4 14.6  HCT 42.7 42.0  MCV 92.2 92.5  MCH 31.1 32.2  MCHC 33.7 34.8  RDW 12.1 12.0  PLT 207 223   Cardiac EnzymesNo results for input(s): TROPONINI in the last 168 hours. No results for input(s): TROPIPOC in the last 168 hours.  BNPNo results for input(s): BNP, PROBNP in the last 168 hours.  DDimer No results for input(s): DDIMER in the last 168 hours. TSH: No results found for: TSH Lipids: Lab Results  Component Value Date   CHOL 136 02/19/2018   HDL 51 02/19/2018   LDLCALC 63 02/19/2018   TRIG 108 02/19/2018   CHOLHDL 2.7 02/19/2018   HgbA1c: Lab Results  Component Value Date   HGBA1C 5.6 02/19/2018    Radiology/Studies:  Ct Head Wo Contrast  Result Date: 02/18/2018 CLINICAL DATA:  Diplopia. EXAM: CT HEAD WITHOUT CONTRAST TECHNIQUE: Contiguous  axial images were obtained from the base of the skull through the vertex without intravenous contrast. COMPARISON:  Head CT 06/12/2007 FINDINGS: Brain: Age related atrophy. Mild to moderate chronic small vessel ischemia. No intracranial hemorrhage, mass effect, or midline shift. No hydrocephalus. The basilar cisterns are patent. No evidence of territorial infarct or acute ischemia. No extra-axial or intracranial fluid collection. Vascular: Atherosclerosis of skullbase vasculature without hyperdense vessel or abnormal  calcification. Skull: No fracture or focal lesion. Sinuses/Orbits: Resolve mucosal thickening from prior exam. Paranasal sinuses and mastoid air cells are clear. Visualized orbits are unremarkable. Other: None. IMPRESSION: 1. No acute intracranial abnormality. 2. Atrophy and chronic small vessel ischemia. Electronically Signed   By: Narda Rutherford M.D.   On: 02/18/2018 20:28   Mr [name] Head Wo Contrast  Result Date: 02/19/2018 CLINICAL DATA:  Diplopia and ataxia episode, now resolved. Persistent towards onto diplopia. EXAM: MRA HEAD WITHOUT CONTRAST TECHNIQUE: Angiographic images of the Circle of Willis were obtained using MRA technique without intravenous contrast. COMPARISON:  MRI study yesterday. FINDINGS: Both internal carotid arteries are widely patent into the brain. No siphon stenosis. The anterior and middle cerebral vessels are patent without proximal stenosis, aneurysm or vascular malformation. Both vertebral arteries are widely patent to the basilar. No basilar stenosis. Posterior circulation branch vessels appear normal. IMPRESSION: Normal examination.  No stenosis, occlusion or aneurysm. Electronically Signed   By: Paulina Fusi M.D.   On: 02/19/2018 08:42   Mr Brain Wo Contrast  Result Date: 02/18/2018 CLINICAL DATA:  78 y/o M; Diplopia and ataxia now resolved with persistent horizontal diplopia. EXAM: MRI HEAD WITHOUT CONTRAST TECHNIQUE: Multiplanar, multiecho pulse sequences of the brain and surrounding structures were obtained without intravenous contrast. COMPARISON:  02/18/2017 CT head. FINDINGS: Brain: No acute infarction, hemorrhage, hydrocephalus, extra-axial collection or mass lesion. Few punctate nonspecific T2 FLAIR hyperintensities in subcortical and periventricular white matter are compatible with mild chronic microvascular ischemic changes for age. Mild volume loss of the brain. Vascular: Normal flow voids. Skull and upper cervical spine: Normal marrow signal. Sinuses/Orbits: Mild  mucosal thickening of the bilateral maxillary sinus alveolar recesses. No abnormal signal of the additional visible paranasal sinuses or the mastoid air cells. Orbits are unremarkable. Other: None. IMPRESSION: 1. No acute intracranial abnormality identified. 2. Mild for age chronic microvascular ischemic changes and volume loss of the brain. Electronically Signed   By: Mitzi Hansen M.D.   On: 02/18/2018 22:50   Vas US Carotid  Result Date: 02/19/2018 Carotid Arterial Duplex Study Indications:  TIA, Visual disturbance and gait. Risk Factors: Hypertension, hyperlipidemia. Performing Technologist: Sherren Kerns RVS  Examination Guidelines: A complete evaluation includes B-mode imaging, spectral Doppler, color Doppler, and power Doppler as needed of all accessible portions of each vessel. Bilateral testing is considered an integral part of a complete examination. Limited examinations for reoccurring indications may be performed as noted.  Right Carotid Findings: +----------+--------+--------+--------+---------------------+------------------+           PSV cm/sEDV cm/sStenosisDescribe             Comments           +----------+--------+--------+--------+---------------------+------------------+ CCA Prox  93      15                                   intimal thickening +----------+--------+--------+--------+---------------------+------------------+ CCA Distal65      12  intimal thickening +----------+--------+--------+--------+---------------------+------------------+ ICA Prox  57      14              irregular and                                                             calcific                                +----------+--------+--------+--------+---------------------+------------------+ ICA Distal70      26                                                       +----------+--------+--------+--------+---------------------+------------------+ ECA       93      11                                                      +----------+--------+--------+--------+---------------------+------------------+ +----------+--------+-------+--------+-------------------+           PSV cm/sEDV cmsDescribeArm Pressure (mmHG) +----------+--------+-------+--------+-------------------+ [id number]                                         +----------+--------+-------+--------+-------------------+ +---------+--------+--+--------+--+ VertebralPSV cm/s47EDV cm/s13 +---------+--------+--+--------+--+  Left Carotid Findings: +----------+--------+--------+--------+------------+------------------+           PSV cm/sEDV cm/sStenosisDescribe    Comments           +----------+--------+--------+--------+------------+------------------+ CCA Prox  69      14                          intimal thickening +----------+--------+--------+--------+------------+------------------+ CCA Distal62      15                          intimal thickening +----------+--------+--------+--------+------------+------------------+ ICA Prox  51      8               heterogenous                   +----------+--------+--------+--------+------------+------------------+ ICA Distal34      10                                             +----------+--------+--------+--------+------------+------------------+ ECA       128     26                                             +----------+--------+--------+--------+------------+------------------+ +----------+--------+--------+--------+-------------------+ SubclavianPSV cm/sEDV cm/sDescribeArm Pressure (mmHG) +----------+--------+--------+--------+-------------------+           111                                         +----------+--------+--------+--------+-------------------+ +---------+--------+--+--------+-+  VertebralPSV cm/s32EDV cm/s5 +---------+--------+--+--------+-+  Summary: Right Carotid: The extracranial vessels were near-normal with only minimal wall                thickening or plaque. Left Carotid: The extracranial vessels were near-normal with only minimal wall               thickening or plaque. Vertebrals:  Bilateral vertebral arteries demonstrate antegrade flow. Subclavians: Normal flow hemodynamics were seen in bilateral subclavian              arteries. *See table(s) above for measurements and observations.  Electronically signed by Lemar Livings MD on 02/19/2018 at 2:40:56 PM.    Final     Assessment and Plan:   1.  Cardiomyopathy: -Left ventricular dysfunction seen on echo this admission, no history of same, last cardiac evaluation was in 1999 -No ongoing ischemic symptoms, no signs or symptoms of volume overload -ECG is without acute changes -Unclear if ischemic versus nonischemic cardiomyopathy, distinction needs to be made. -Once no further evaluation for neurologic issues is needed, discuss with Neuro if okay to proceed with ischemic evaluation  2.  TIA: - Possible PFO on echo in the setting of TIA, needs TEE  3.  Bradycardia: -No life-threatening arrhythmias seen, no indication for pacemaker at this time -Unclear if low heart rate is associated with any symptoms -Event monitor has been ordered, complete this and follow-up as outpatient  Otherwise, per IM Principal Problem:   Diplopia Active Problems:   Essential hypertension   Gout   Hyperlipidemia   Unsteady gait   Bradycardia   TIA (transient ischemic attack)     For questions or updates, please contact [facility] Please consult [email or web address] for contact info under Cardiology/STEMI.   SignedTheodore Demark, PA-C  02/19/2018 7:08 PM

## 2018-02-19 NOTE — Consult Note (Signed)
Neurology Consultation Reason for Consult: Unsteadiness and visual change Referring Physician: Juleen China, S  CC: Unsteadiness and visual change  History is obtained from: Patient  HPI: Edwin Randall is a 78 y.o. male with a history of hypertension, hyperlipidemia who presents with unsteadiness and visual changes started at noon today.  He states that he suddenly felt very unsteady.  He denies lightheadedness or feeling like he was going to pass out.  He states that he had to grab onto things to keep from falling and that his legs did not seem to be quite working the way they normally do.  He denies lateralizing symptoms.  He sat down but continued to feel dizzy, and took his blood pressure.  His blood pressure was fine, but he was bradycardic in the 40s.  He states that his symptoms of disequilibrium gradually improved over the next half hour to hour.  He also, however had a visual change which he describes as everything having a slight halo which were side-by-side.  This is gradually improved over the course of the day but lasted significantly longer.  He saw his primary care physician earlier today who referred him to the ER.   LKW: Noon tpa given?: no, resolution of symptoms   ROS: A 14 point ROS was performed and is negative except as noted in the HPI.   Past Medical History:  Diagnosis Date  . Depression   . HLD (hyperlipidemia)   . Hypertension      Family History  Family history unknown: Yes     Social History:  reports that he has quit smoking. He has never used smokeless tobacco. He reports that he does not drink alcohol or use drugs.   Exam: Current vital signs: BP (!) 158/95   Pulse 76   Temp 98 F (36.7 C) (Oral)   Resp 16   Ht 5\' 11"  (1.803 m)   Wt 88.5 kg   SpO2 97%   BMI 27.20 kg/m  Vital signs in last 24 hours: Temp:  [98 F (36.7 C)] 98 F (36.7 C) (01/08 1851) Pulse Rate:  [68-83] 76 (01/09 0018) Resp:  [12-26] 16 (01/09 0018) BP: (158-186)/(79-98)  158/95 (01/09 0018) SpO2:  [96 %-99 %] 97 % (01/09 0018) Weight:  [88.5 kg] 88.5 kg (01/08 1848)   Physical Exam  Constitutional: Appears well-developed and well-nourished.  Psych: Affect appropriate to situation Eyes: No scleral injection HENT: No OP obstrucion Head: Normocephalic.  Cardiovascular: Normal rate and regular rhythm.  Respiratory: Effort normal, non-labored breathing GI: Soft.  No distension. There is no tenderness.  Skin: WDI  Neuro: Mental Status: Patient is awake, alert, oriented to person, place, month, year, and situation. Patient is able to give a clear and coherent history. No signs of aphasia or neglect Cranial Nerves: II: Visual Fields are full. Pupils are equal, round, and reactive to light.   III,IV, VI: EOMI without ptosis or diploplia.  V: Facial sensation is symmetric to temperature VII: Facial movement is symmetric.  VIII: hearing is intact to voice X: Uvula elevates symmetrically XI: Shoulder shrug is symmetric. XII: tongue is midline without atrophy or fasciculations.  Motor: Tone is normal. Bulk is normal. 5/5 strength was present in all four extremities.  Sensory: Sensation is symmetric to light touch and temperature in the arms and legs. Cerebellar: FNF intact bilaterally.    I have reviewed labs in epic and the results pertinent to this consultation are: BMP-unremarkable  I have reviewed the images obtained: MRI brain-no  acute findings  Impression: 78 year old male with transient disequilibrium and what sounds like double vision which is gradually improved over the course of the day.  Though his pulse was low, his blood pressure does not sound like it ever was particularly low, and the visual changes as described would be very unusual for hypoperfusion.  I suspect that this is rather small brainstem transient ischemic attack and he is currently back to his baseline.  I would favor treating it as such.  Recommendations: - HgbA1c,  fasting lipid panel - MRI, MRA  of the brain without contrast - Frequent neuro checks - Echocardiogram - Carotid dopplers - Prophylactic therapy-Antiplatelet med: Aspirin -81 mg daily and Plavix 75 mg daily following 300 mg load - Risk factor modification - Telemetry monitoring - PT consult, OT consult, Speech consult - Stroke team to follow   Ritta Slot, MD [facility] [phone]  If 7pm- 7am, please page neurology on call as listed in AMION.

## 2018-02-19 NOTE — Progress Notes (Addendum)
Attending MD note  Patient was seen, examined,treatment plan was discussed with the PA-S.  I have personally reviewed the clinical findings, lab, imaging studies and management of this patient in detail. I agree with the documentation, as recorded by the PA-Student  Edwin Randall is a 78 y.o. year old male with medical history significant for gout, hypertension, depression, hyperlipidemia who presented on 02/18/2018 with acute changes in vision and presyncope and was admitted for further evaluation due to concern for TIA.  Also history was obtained by wife, patient also corroborated.  Prior to admission patient suddenly developed sensation of feeling like he was about to fall but did not with associated with some double vision particularly while looking at his hand noting will like almost a shadow.  His wife reports he had a similar episode approximately 3 weeks ago, at that time he did not respond to her questions and had a blank stare with reported confusion.  Patient also reports progressive fatigue for several weeks but denies any chest pain, palpitations, shortness of breath, cough, recent sick contacts, loss of sensation.    In the ED and was found to have  blood pressure was noted to be 186/91.  Admitting lab work was unremarkable.  Due to his unsteadiness and reported visual changes neurology was consulted in ED and recommended MRI/MRA due to concern for small brainstem TIA.  Patient was admitted for further evaluation.  Physical: Vitals:   02/19/18 0709 02/19/18 1356  BP: (!) 167/91 (!) 141/66  Pulse: (!) 58 76  Resp: 19 18  Temp: 97.9 F (36.6 C) 98.5 F (36.9 C)  SpO2: 99% 97%    Constitutional:normal appearing male Eyes: EOMI, anicteric, normal conjunctivae ENMT: Oropharynx with moist mucous membranes, normal dentition Neck: FROM Cardiovascular: RRR no MRGs, with no peripheral edema Respiratory: Normal respiratory effort, clear breath sounds  Abdomen:  Soft,non-tender, Skin: No rash ulcers, or lesions. Without skin tenting  Neurologic: Moving all extremities with no appreciable focal deficits. Psychiatric:Appropriate affect, and mood. Mental status AAOx3  Plan  1. Double vision and unsteadiness presumed secondary to TIA, improving.  This afternoon denies any current changes in vision reports steady gait which seems consistent with TIA given quick resolution of symptoms and no ischemic abnormalities noted on MRI brain, MRA head, and no stenosis/occlusion or aneurysm on carotid Dopplers.  Additionally in terms of risk factors blood pressures well controlled and A1c was within normal limits neurology recommends DAPT with aspirin and Plavix for 3 weeks followed by aspirin alone. Awaiting PT eval  2. Reduced EF and diffuse hypokinesis, new.  Found on TTE obtained for risk factor evaluation for presumed TIA.  EF 40-45% with grade 1 diastolic dysfunction.  Patient is chest pain-free currently has normal volume status.  Cardiology was consulted this afternoon, continue to closely monitor await further recommendations  3. Intermittent bradycardia, stable upon arrival heart rate was in the 40s is fluctuated to the 60s.  This occurs both during sleep and awake periods.  On telemetry monitoring patient has had several episodes of low heart rate.  Question if some of fatigue and unsteady gait could be attributed to bradycardia.  Awaiting further recommendations by cardiology.  Not on any current beta-blockers or other contributing medications.  4. Hypertension at goal continue home amlodipine, losartan  5. Hyperlipidemia, stable continue home statin  6. Gout, stable continue home allopurinol  7. Depression, stable continue home Wellbutrin  Rest as below  Edwin Randall  [facility]  PROGRESS NOTE  Edwin Randall WCB:[id number] DOB: 09/27/1940 DOA: 02/18/2018 PCP: Edwin Brunette, MD  HPI/Brief Narrative  Edwin Randall is a 78 y.o. year  old male with medical history significant for depression, HTN, HLD who presented on 02/18/2018 with blurry vision and possible TIA and was found to have diplopia and TIA. He was initially evaluated by his PCP, who sent him to the ER. Patient states that yesterday around noon he felt a "falling sensation" where he states his legs didn't feel like they were working properly. He reports associated blurry vision that started at the same time that he describes as seeing a shadow around his fingers when he holds them out in front of them. He states he sat down during the episode and checked his BP, which was normal, but noticed his HR was around 40 bpm. He ate lunch and states his HR improved within an hour. His other sx's improved as the day progressed. He also has hx of chronic tinnitus d/t hx of concussions, which he states significantly worsened during his episode yesterday. He denies any hx of smoking.   In the ED, the patient had a negative stroke screen. He had an MRI of the brain and CT of the head that were both negative. Neuro was consulted due to the possible TIA, who suspected diplopia and started prophylactic therapy with ASA and Plavix.   Subjective He states he has not experienced any disequilibrium symptoms since yesterday. He is still experiencing some diplopia, but states it is much better than yesterday. He denies any weakness, hearing loss, speech changes, confusion, or dizziness. His chronic tinnitus is at baseline today.  Per the pt's wife, the patient has had increased stressors over the past couple of years dealing with his daughter's medical problems as well as his son's recent death last 2022-09-07. Since 09/07/2022, the pt's wife states he has experienced a few episodes of transient confusion that last 3-4 hours. He also experienced a change in gait with these episodes similar to his episode yesterday. He was started on Wellbutrin for his depression 2 weeks ago and is scheduled for f/u in February.     Assessment/Plan: #1 Diplopia- Patient is still experiencing some blurred vision, but states it is much improved from yesterday. Will continue to monitor.   #2 TIA- Patient has been started on prophylactic therapy with ASA and Plavix by neurology. CN II-XI intact on physical exam today. He has normal sensation and full strength. HgB A1c was WNL. TTE and carotid dopplers were performed.   #3 Depression- Continue Wellbutrin.  #4 HTN- Continue Amlodipine and Losartan.  #5 HLD- Continue Crestor.  #6 Hx of Gout- Continue Allopurinol.  Cultures:  none  Telemetry: Patient is on cardiac monitoring   DVT prophylaxis: Lovenox  Consultants:  Neurology, PT, OT following patient    Procedures:   none   Antimicrobials: none  Code Status: FULL  Family Communication: Patient's wife was in room with patient and updated on plan.    Disposition Plan:    Anticipate discharge home with clinical improvement.   Objective: Vitals:   02/19/18 0018 02/19/18 0219 02/19/18 0500 02/19/18 0709  BP: (!) 158/95 133/68 (!) 143/64 (!) 167/91  Pulse: 76 86 (!) 50 (!) 58  Resp: 16 16 15 19   Temp:    97.9 F (36.6 C)  TempSrc:    Oral  SpO2: 97% 94% 99% 99%  Weight:      Height:        Intake/Output  Summary (Last 24 hours) at 02/19/2018 1152 Last data filed at 02/18/2018 2135 Gross per 24 hour  Intake -  Output 800 ml  Net -800 ml   Filed Weights   02/18/18 1848  Weight: 88.5 kg    Exam: Constitutional:normal appearing male Eyes: EOMI, anicteric, normal conjunctivae, PERRLA. ENMT: Oropharynx with moist mucous membranes, normal dentition Neck: FROM Cardiovascular: RRR no MRGs, with no peripheral edema. DP and PT pulses 2+ bilaterally.  Respiratory: Normal respiratory effort, clear breath sounds  Skin: No rash ulcers, or lesions. Without skin tenting  Neurologic: Grossly no focal neuro deficit. CN II-XII intact. Intact sensation in upper and lower extremities. 5/5 strength in BUE  and BLE.  Psychiatric: Appropriate affect, and mood. Mental status AAOx3  Data Reviewed: CBC: Recent Labs  Lab 02/18/18 2036 02/19/18 0002  WBC 5.9 6.3  NEUTROABS 3.5  --   HGB 14.4 14.6  HCT 42.7 42.0  MCV 92.2 92.5  PLT 207 223   Basic Metabolic Panel: Recent Labs  Lab 02/18/18 1940 02/19/18 0002  NA 136  --   K 3.6  --   CL 105  --   CO2 23  --   GLUCOSE 101*  --   BUN 16  --   CREATININE 1.00 0.99  CALCIUM 8.2*  --    GFR: Estimated Creatinine Clearance: 65.5 mL/min (by C-G formula based on SCr of 0.99 mg/dL). Liver Function Tests: No results for input(s): AST, ALT, ALKPHOS, BILITOT, PROT, ALBUMIN in the last 168 hours. No results for input(s): LIPASE, AMYLASE in the last 168 hours. No results for input(s): AMMONIA in the last 168 hours. Coagulation Profile: No results for input(s): INR, PROTIME in the last 168 hours. Cardiac Enzymes: No results for input(s): CKTOTAL, CKMB, CKMBINDEX, TROPONINI in the last 168 hours. BNP (last 3 results) No results for input(s): PROBNP in the last 8760 hours. HbA1C: Recent Labs    02/19/18 0300  HGBA1C 5.6   CBG: No results for input(s): GLUCAP in the last 168 hours. Lipid Profile: Recent Labs    02/19/18 0300  CHOL 136  HDL 51  LDLCALC 63  TRIG 108  CHOLHDL 2.7   Thyroid Function Tests: No results for input(s): TSH, T4TOTAL, FREET4, T3FREE, THYROIDAB in the last 72 hours. Anemia Panel: No results for input(s): VITAMINB12, FOLATE, FERRITIN, TIBC, IRON, RETICCTPCT in the last 72 hours. Urine analysis: No results found for: COLORURINE, APPEARANCEUR, LABSPEC, PHURINE, GLUCOSEU, HGBUR, BILIRUBINUR, KETONESUR, PROTEINUR, UROBILINOGEN, NITRITE, LEUKOCYTESUR Sepsis Labs: @LABRCNTIP (procalcitonin:4,lacticidven:4)  )No results found for this or any previous visit (from the past 240 hour(s)).    Studies: Ct Head Wo Contrast  Result Date: 02/18/2018 CLINICAL DATA:  Diplopia. EXAM: CT HEAD WITHOUT CONTRAST TECHNIQUE:  Contiguous axial images were obtained from the base of the skull through the vertex without intravenous contrast. COMPARISON:  Head CT 06/12/2007 FINDINGS: Brain: Age related atrophy. Mild to moderate chronic small vessel ischemia. No intracranial hemorrhage, mass effect, or midline shift. No hydrocephalus. The basilar cisterns are patent. No evidence of territorial infarct or acute ischemia. No extra-axial or intracranial fluid collection. Vascular: Atherosclerosis of skullbase vasculature without hyperdense vessel or abnormal calcification. Skull: No fracture or focal lesion. Sinuses/Orbits: Resolve mucosal thickening from prior exam. Paranasal sinuses and mastoid air cells are clear. Visualized orbits are unremarkable. Other: None. IMPRESSION: 1. No acute intracranial abnormality. 2. Atrophy and chronic small vessel ischemia. Electronically Signed   By: Narda RutherfordMelanie  Sanford M.D.   On: 02/18/2018 20:28   [facility]  [facility]  Result Date: 02/19/2018 CLINICAL DATA:  Diplopia and ataxia episode, now resolved. Persistent towards onto diplopia. EXAM: MRA HEAD WITHOUT CONTRAST TECHNIQUE: Angiographic images of the Circle of Willis were obtained using MRA technique without intravenous contrast. COMPARISON:  MRI study yesterday. FINDINGS: Both internal carotid arteries are widely patent into the brain. No siphon stenosis. The anterior and middle cerebral vessels are patent without proximal stenosis, aneurysm or vascular malformation. Both vertebral arteries are widely patent to the basilar. No basilar stenosis. Posterior circulation branch vessels appear normal. IMPRESSION: Normal examination.  No stenosis, occlusion or aneurysm. Electronically Signed   By: Paulina Fusi M.D.   On: 02/19/2018 08:42   Mr Brain Wo Contrast  Result Date: 02/18/2018 CLINICAL DATA:  78 y/o M; Diplopia and ataxia now resolved with persistent horizontal diplopia. EXAM: MRI HEAD WITHOUT CONTRAST TECHNIQUE: Multiplanar, multiecho pulse  sequences of the brain and surrounding structures were obtained without intravenous contrast. COMPARISON:  02/18/2017 CT head. FINDINGS: Brain: No acute infarction, hemorrhage, hydrocephalus, extra-axial collection or mass lesion. Few punctate nonspecific T2 FLAIR hyperintensities in subcortical and periventricular white matter are compatible with mild chronic microvascular ischemic changes for age. Mild volume loss of the brain. Vascular: Normal flow voids. Skull and upper cervical spine: Normal marrow signal. Sinuses/Orbits: Mild mucosal thickening of the bilateral maxillary sinus alveolar recesses. No abnormal signal of the additional visible paranasal sinuses or the mastoid air cells. Orbits are unremarkable. Other: None. IMPRESSION: 1. No acute intracranial abnormality identified. 2. Mild for age chronic microvascular ischemic changes and volume loss of the brain. Electronically Signed   By: Mitzi Hansen M.D.   On: 02/18/2018 22:50    Scheduled Meds: .  stroke: mapping our early stages of recovery book   Does not apply Once  . allopurinol  100 mg Oral Daily  . amLODipine  2.5 mg Oral Daily  . aspirin EC  81 mg Oral Daily  . buPROPion  300 mg Oral Daily  . [START ON 02/20/2018] clopidogrel  75 mg Oral Daily  . enoxaparin (LOVENOX) injection  40 mg Subcutaneous Daily  . folic acid  1 mg Oral Daily  . losartan  100 mg Oral Daily  . multivitamin with minerals  1 tablet Oral Daily  . rosuvastatin  5 mg Oral Daily    Continuous Infusions: . sodium chloride       LOS: 0 days     Wilmon Arms, PA-S2 02/19/18, 2:37 PM

## 2018-02-19 NOTE — ED Notes (Signed)
Heart healthy breakfast tray ordered for patient at this time

## 2018-02-19 NOTE — Progress Notes (Addendum)
STROKE TEAM PROGRESS NOTE   INTERVAL HISTORY His wife is at the bedside.  Patient in bed, NAD. Patient hard of hearing. Would like 30 day heart monitor ( contacted Elease Hashimoto trent to schedule).    Vitals:   02/19/18 0018 02/19/18 0219 02/19/18 0500 02/19/18 0709  BP: (!) 158/95 133/68 (!) 143/64 (!) 167/91  Pulse: 76 86 (!) 50 (!) 58  Resp: 16 16 15 19   Temp:    97.9 F (36.6 C)  TempSrc:    Oral  SpO2: 97% 94% 99% 99%  Weight:      Height:        CBC:  Recent Labs  Lab 02/18/18 2036 02/19/18 0002  WBC 5.9 6.3  NEUTROABS 3.5  --   HGB 14.4 14.6  HCT 42.7 42.0  MCV 92.2 92.5  PLT 207 223    Basic Metabolic Panel:  Recent Labs  Lab 02/18/18 1940 02/19/18 0002  NA 136  --   K 3.6  --   CL 105  --   CO2 23  --   GLUCOSE 101*  --   BUN 16  --   CREATININE 1.00 0.99  CALCIUM 8.2*  --    Lipid Panel:     Component Value Date/Time   CHOL 136 02/19/2018 0300   TRIG 108 02/19/2018 0300   HDL 51 02/19/2018 0300   CHOLHDL 2.7 02/19/2018 0300   VLDL 22 02/19/2018 0300   LDLCALC 63 02/19/2018 0300   HgbA1c:  Lab Results  Component Value Date   HGBA1C 5.6 02/19/2018   Urine Drug Screen: No results found for: LABOPIA, COCAINSCRNUR, LABBENZ, AMPHETMU, THCU, LABBARB  Alcohol Level     Component Value Date/Time   ETH 139 (H) 02/10/2014 1850    IMAGING Ct Head Wo Contrast  Result Date: 02/18/2018 CLINICAL DATA:  Diplopia. EXAM: CT HEAD WITHOUT CONTRAST TECHNIQUE: Contiguous axial images were obtained from the base of the skull through the vertex without intravenous contrast. COMPARISON:  Head CT 06/12/2007 FINDINGS: Brain: Age related atrophy. Mild to moderate chronic small vessel ischemia. No intracranial hemorrhage, mass effect, or midline shift. No hydrocephalus. The basilar cisterns are patent. No evidence of territorial infarct or acute ischemia. No extra-axial or intracranial fluid collection. Vascular: Atherosclerosis of skullbase vasculature without hyperdense  vessel or abnormal calcification. Skull: No fracture or focal lesion. Sinuses/Orbits: Resolve mucosal thickening from prior exam. Paranasal sinuses and mastoid air cells are clear. Visualized orbits are unremarkable. Other: None. IMPRESSION: 1. No acute intracranial abnormality. 2. Atrophy and chronic small vessel ischemia. Electronically Signed   By: Narda Rutherford M.D.   On: 02/18/2018 20:28   Mr [name] Head Wo Contrast  Result Date: 02/19/2018 CLINICAL DATA:  Diplopia and ataxia episode, now resolved. Persistent towards onto diplopia. EXAM: MRA HEAD WITHOUT CONTRAST TECHNIQUE: Angiographic images of the Circle of Willis were obtained using MRA technique without intravenous contrast. COMPARISON:  MRI study yesterday. FINDINGS: Both internal carotid arteries are widely patent into the brain. No siphon stenosis. The anterior and middle cerebral vessels are patent without proximal stenosis, aneurysm or vascular malformation. Both vertebral arteries are widely patent to the basilar. No basilar stenosis. Posterior circulation branch vessels appear normal. IMPRESSION: Normal examination.  No stenosis, occlusion or aneurysm. Electronically Signed   By: Paulina Fusi M.D.   On: 02/19/2018 08:42   Mr Brain Wo Contrast  Result Date: 02/18/2018 CLINICAL DATA:  78 y/o M; Diplopia and ataxia now resolved with persistent horizontal diplopia. EXAM: MRI HEAD WITHOUT CONTRAST  TECHNIQUE: Multiplanar, multiecho pulse sequences of the brain and surrounding structures were obtained without intravenous contrast. COMPARISON:  02/18/2017 CT head. FINDINGS: Brain: No acute infarction, hemorrhage, hydrocephalus, extra-axial collection or mass lesion. Few punctate nonspecific T2 FLAIR hyperintensities in subcortical and periventricular white matter are compatible with mild chronic microvascular ischemic changes for age. Mild volume loss of the brain. Vascular: Normal flow voids. Skull and upper cervical spine: Normal marrow signal.  Sinuses/Orbits: Mild mucosal thickening of the bilateral maxillary sinus alveolar recesses. No abnormal signal of the additional visible paranasal sinuses or the mastoid air cells. Orbits are unremarkable. Other: None. IMPRESSION: 1. No acute intracranial abnormality identified. 2. Mild for age chronic microvascular ischemic changes and volume loss of the brain. Electronically Signed   By: Mitzi HansenLance  Furusawa-Stratton M.D.   On: 02/18/2018 22:50    PHYSICAL EXAM  Pleasant obese elderly caucasian male not in distress. . Afebrile. Head is nontraumatic. Neck is supple without bruit.    Cardiac exam no murmur or gallop. Lungs are clear to auscultation. Distal pulses are well felt.i Neurological Exam ;  Awake  Alert oriented x 3. Normal speech and language.eye movements full without nystagmus.fundi were not visualized. Vision acuity and fields appear normal. Hearing is normal. Palatal movements are normal. Face symmetric. Tongue midline. Normal strength, tone, reflexes and coordination. Normal sensation. Gait deferred.   ASSESSMENT/PLAN Edwin Randall is a 78 y.o. male with history of HTN, HLD presenting with unsteadiness and visual changes.  He states that he suddenly felt very unsteady.  He denies lightheadedness or feeling like he was going to pass out.  He states that he had to grab onto things to keep from falling and that his legs did not seem to be quite working the way they normally do.  He denies lateralizing symptoms.  He sat down but continued to feel dizzy, and took his blood pressure.  His blood pressure was fine, but he was bradycardic in the 40s.  He states that his symptoms of disequilibrium gradually improved over the next half hour to hour.  He also, however had a visual change which he describes as everything having a slight halo which were side-by-side.  This is gradually improved over the course of the day but lasted significantly longer.He saw his primary care physician earlier today  who referred him to the ER.  Posterior circulation TIA:  secondary to  small vessel disease source  CT head 1. No acute intracranial abnormality. 2. Atrophy and chronic small vessel ischemia.  MRI  1. No acute intracranial abnormality identified. 2. Mild for age chronic microvascular ischemic changes and volume loss of the brain.  MRA Normal examination.  No stenosis, occlusion or aneurysm.  Carotid Doppler  Right Carotid: The extracranial vessels were near-normal with only minimal wall thickening or plaque. Left Carotid: The extracranial vessels were near-normal with only minimal wall thickening or plaque. Vertebrals:  Bilateral vertebral arteries demonstrate antegrade flow. Subclavians: Normal flow hemodynamics were seen in bilateral subclavian arteries.  2D Echo   Left ventricle: The cavity size was moderately dilated. Systolic   function was mildly to moderately reduced. The estimated ejection   fraction was in the range of 40% to 45%. Diffuse hypokinesis.   Doppler parameters are consistent with abnormal left ventricular   relaxation (grade 1 diastolic dysfunction). - Aortic valve: Sclerosis without stenosis. Valve area (VTI): 1.76   cm^2. Valve area (Vmax): 1.46 cm^2. Valve area (Vmean): 1.59   cm^2. - Mitral valve: Calcified annulus. Mildly thickened  leaflets .   There was mild regurgitation. Valve area by pressure half-time:   2.44 cm^2. - Left atrium: The atrium was moderately dilated. - Atrial septum: Poorly visualized but appears mobile cannot r/o PFO.  LDL 63  HgbA1c 5.6  Lovenox for VTE prophylaxis Diet Order            Diet Heart Room service appropriate? Yes; Fluid consistency: Thin  Diet effective now               No antithrombotic prior to admission, now on aspirin 81 mg daily and clopidogrel 75 mg daily. For 3 weeks and then Aspirin 81mg  daily.  Therapy recommendations:  pending  Disposition:  pending  Hypertension  Stable . Permissive  hypertension (OK if < 220/120) but gradually normalize in 5-7 days . Long-term BP goal normotensive  Hyperlipidemia  Home meds:  Crestor 10 mg, Crestor 5 mgresumed in hospital  LDL 63, goal < 70  Continue statin at discharge  Diabetes type II  HgbA1c 5.6, goal < 7.0  Controlled  Other Stroke Risk Factors  Advanced age   Other Active Problems   Hospital day # 0   Edwin Lucks, MSN, NP-C [facility] [phone] I have personally obtained history,examined this patient, reviewed notes, independently viewed imaging studies, participated in medical decision making and plan of care.ROS completed by me personally and pertinent positives fully documented  I have made any additions or clarifications directly to the above note. Agree with note above.  He presented with transient vision difficulties and gait ataxia likely due to posterior circulation TIA but patient and family feels this has happened on several occasions in the setting of bradycardia and he has not had any prior cardiac evaluation. Recommend cardiology consult and prolonged cardiac monitoring for sick sinus syndrome. Aspirin and Plavix for 3 weeks followed by aspirin alone. Aggressive risk factor modification. Long discussion with the patient and wife and answered questions. Greater than 50% time during this 35 minute visit was spent on counseling and coordination of care about his TIA and bradycardia and answering questions  Delia Heady, MD Medical Director [facility] Pager: [phone] 02/19/2018 3:50 PM  To contact Stroke Continuity provider, please refer to [email or web address]. After hours, contact General Neurology

## 2018-02-19 NOTE — Progress Notes (Signed)
PT Cancellation Note  Patient Details Name: Edwin Randall MRN: [id number] DOB: 1940/04/14   Cancelled Treatment:    Reason Eval/Treat Not Completed: Patient at procedure or test/unavailable. Pt currently off unit for testing. Will check back as schedule allows to initiate PT evaluation.   Marylynn Pearson 02/19/2018, 11:24 AM   Conni Slipper, PT, DPT Acute Rehabilitation Services Pager: [phone] Office: [phone]

## 2018-02-19 NOTE — ED Notes (Signed)
Nutrition services notified of patient's transfer to [facility] and will deliver breakfast there.

## 2018-02-20 ENCOUNTER — Encounter (HOSPITAL_COMMUNITY)
Admission: EM | Disposition: A | Discharge status: Home / Self Care | Payer: Self-pay | Source: Home / Self Care | Attending: Internal Medicine

## 2018-02-20 ENCOUNTER — Encounter (HOSPITAL_COMMUNITY): Payer: Self-pay | Admitting: Cardiology

## 2018-02-20 DIAGNOSIS — I428 Other cardiomyopathies: Secondary | ICD-10-CM | POA: Diagnosis present

## 2018-02-20 DIAGNOSIS — R001 Bradycardia, unspecified: Secondary | ICD-10-CM | POA: Diagnosis present

## 2018-02-20 DIAGNOSIS — I519 Heart disease, unspecified: Secondary | ICD-10-CM

## 2018-02-20 DIAGNOSIS — F329 Major depressive disorder, single episode, unspecified: Secondary | ICD-10-CM | POA: Diagnosis present

## 2018-02-20 DIAGNOSIS — I358 Other nonrheumatic aortic valve disorders: Secondary | ICD-10-CM | POA: Diagnosis present

## 2018-02-20 DIAGNOSIS — H9319 Tinnitus, unspecified ear: Secondary | ICD-10-CM | POA: Diagnosis present

## 2018-02-20 DIAGNOSIS — H532 Diplopia: Secondary | ICD-10-CM | POA: Diagnosis not present

## 2018-02-20 DIAGNOSIS — E785 Hyperlipidemia, unspecified: Secondary | ICD-10-CM | POA: Diagnosis not present

## 2018-02-20 DIAGNOSIS — Z87891 Personal history of nicotine dependence: Secondary | ICD-10-CM | POA: Diagnosis not present

## 2018-02-20 DIAGNOSIS — M109 Gout, unspecified: Secondary | ICD-10-CM | POA: Diagnosis present

## 2018-02-20 DIAGNOSIS — Z79899 Other long term (current) drug therapy: Secondary | ICD-10-CM | POA: Diagnosis not present

## 2018-02-20 DIAGNOSIS — R2681 Unsteadiness on feet: Secondary | ICD-10-CM | POA: Diagnosis not present

## 2018-02-20 DIAGNOSIS — R262 Difficulty in walking, not elsewhere classified: Secondary | ICD-10-CM | POA: Diagnosis present

## 2018-02-20 DIAGNOSIS — Z87820 Personal history of traumatic brain injury: Secondary | ICD-10-CM | POA: Diagnosis not present

## 2018-02-20 DIAGNOSIS — I2510 Atherosclerotic heart disease of native coronary artery without angina pectoris: Secondary | ICD-10-CM | POA: Diagnosis present

## 2018-02-20 DIAGNOSIS — G459 Transient cerebral ischemic attack, unspecified: Secondary | ICD-10-CM | POA: Diagnosis not present

## 2018-02-20 DIAGNOSIS — I5021 Acute systolic (congestive) heart failure: Secondary | ICD-10-CM | POA: Diagnosis not present

## 2018-02-20 DIAGNOSIS — H9190 Unspecified hearing loss, unspecified ear: Secondary | ICD-10-CM | POA: Diagnosis present

## 2018-02-20 DIAGNOSIS — I10 Essential (primary) hypertension: Secondary | ICD-10-CM | POA: Diagnosis not present

## 2018-02-20 DIAGNOSIS — I739 Peripheral vascular disease, unspecified: Secondary | ICD-10-CM | POA: Diagnosis present

## 2018-02-20 DIAGNOSIS — Z79891 Long term (current) use of opiate analgesic: Secondary | ICD-10-CM | POA: Diagnosis not present

## 2018-02-20 DIAGNOSIS — E119 Type 2 diabetes mellitus without complications: Secondary | ICD-10-CM | POA: Diagnosis present

## 2018-02-20 HISTORY — PX: LEFT HEART CATH AND CORONARY ANGIOGRAPHY: CATH118249

## 2018-02-20 LAB — BASIC METABOLIC PANEL
Anion gap: 9 (ref 5–15)
BUN: 14 mg/dL (ref 8–23)
CALCIUM: 9 mg/dL (ref 8.9–10.3)
CO2: 24 mmol/L (ref 22–32)
Chloride: 105 mmol/L (ref 98–111)
Creatinine, Ser: 1.03 mg/dL (ref 0.61–1.24)
GFR calc Af Amer: >60 mL/min (ref >60)
GFR calc non Af Amer: >60 mL/min (ref >60)
Glucose, Bld: 117 mg/dL — ABNORMAL HIGH (ref 70–99)
Potassium: 4 mmol/L (ref 3.5–5.1)
Sodium: 138 mmol/L (ref 135–145)

## 2018-02-20 LAB — CBC
HCT: 41.7 % (ref 39.0–52.0)
Hemoglobin: 14.8 g/dL (ref 13.0–17.0)
MCH: 32.9 pg (ref 26.0–34.0)
MCHC: 35.5 g/dL (ref 30.0–36.0)
MCV: 92.7 fL (ref 80.0–100.0)
Platelets: 233 10*3/uL (ref 150–400)
RBC: 4.5 MIL/uL (ref 4.22–5.81)
RDW: 12 % (ref 11.5–15.5)
WBC: 6 10*3/uL (ref 4.0–10.5)
nRBC: 0 % (ref 0.0–0.2)

## 2018-02-20 LAB — PROTIME-INR
INR: 1.07
Prothrombin Time: 13.8 seconds (ref 11.4–15.2)

## 2018-02-20 SURGERY — LEFT HEART CATH AND CORONARY ANGIOGRAPHY
Anesthesia: LOCAL

## 2018-02-20 MED ORDER — MIDAZOLAM HCL 2 MG/2ML IJ SOLN
INTRAMUSCULAR | Status: DC | PRN
  Administered 2018-02-20: 1 mg via INTRAVENOUS

## 2018-02-20 MED ORDER — ENOXAPARIN SODIUM 40 MG/0.4ML ~~LOC~~ SOLN
40.0000 mg | SUBCUTANEOUS | Status: DC
  Administered 2018-02-21: 40 mg via SUBCUTANEOUS
  Filled 2018-02-20: qty 0.4

## 2018-02-20 MED ORDER — VERAPAMIL HCL 2.5 MG/ML IV SOLN
INTRAVENOUS | Status: AC
  Filled 2018-02-20: qty 2

## 2018-02-20 MED ORDER — FAMOTIDINE 20 MG PO TABS
20.0000 mg | ORAL_TABLET | Freq: Once | ORAL | Status: AC
  Administered 2018-02-20: 20 mg via ORAL
  Filled 2018-02-20: qty 1

## 2018-02-20 MED ORDER — HEPARIN (PORCINE) IN NACL 1000-0.9 UT/500ML-% IV SOLN
INTRAVENOUS | Status: DC | PRN
  Administered 2018-02-20 (×2): 500 mL

## 2018-02-20 MED ORDER — SODIUM CHLORIDE 0.9% FLUSH: 3.0000 mL | INTRAVENOUS | Status: DC | PRN

## 2018-02-20 MED ORDER — HEPARIN (PORCINE) IN NACL 1000-0.9 UT/500ML-% IV SOLN
INTRAVENOUS | Status: AC
  Filled 2018-02-20: qty 1000

## 2018-02-20 MED ORDER — SODIUM CHLORIDE 0.9 % WEIGHT BASED INFUSION: 1.0000 mL/kg/h | INTRAVENOUS | Status: AC

## 2018-02-20 MED ORDER — SODIUM CHLORIDE 0.9 % IV SOLN: 250.0000 mL | INTRAVENOUS | Status: DC | PRN

## 2018-02-20 MED ORDER — FENTANYL CITRATE (PF) 100 MCG/2ML IJ SOLN
INTRAMUSCULAR | Status: AC
  Filled 2018-02-20: qty 2

## 2018-02-20 MED ORDER — SODIUM CHLORIDE 0.9% FLUSH: 3.0000 mL | Freq: Two times a day (BID) | INTRAVENOUS | Status: DC

## 2018-02-20 MED ORDER — VERAPAMIL HCL 2.5 MG/ML IV SOLN
INTRAVENOUS | Status: DC | PRN
  Administered 2018-02-20: 10 mL via INTRA_ARTERIAL

## 2018-02-20 MED ORDER — LIDOCAINE HCL (PF) 1 % IJ SOLN
INTRAMUSCULAR | Status: DC | PRN
  Administered 2018-02-20: 2 mL

## 2018-02-20 MED ORDER — MIDAZOLAM HCL 2 MG/2ML IJ SOLN
INTRAMUSCULAR | Status: AC
  Filled 2018-02-20: qty 2

## 2018-02-20 MED ORDER — HEPARIN SODIUM (PORCINE) 1000 UNIT/ML IJ SOLN
INTRAMUSCULAR | Status: DC | PRN
  Administered 2018-02-20: 4500 [IU] via INTRAVENOUS

## 2018-02-20 MED ORDER — SODIUM CHLORIDE 0.9 % WEIGHT BASED INFUSION
3.0000 mL/kg/h | INTRAVENOUS | Status: DC
  Administered 2018-02-20 (×2): 3 mL/kg/h via INTRAVENOUS

## 2018-02-20 MED ORDER — LIDOCAINE HCL (PF) 1 % IJ SOLN
INTRAMUSCULAR | Status: AC
  Filled 2018-02-20: qty 30

## 2018-02-20 MED ORDER — IOHEXOL 350 MG/ML SOLN
INTRAVENOUS | Status: DC | PRN
  Administered 2018-02-20: 50 mL via INTRAVENOUS

## 2018-02-20 MED ORDER — FENTANYL CITRATE (PF) 100 MCG/2ML IJ SOLN
INTRAMUSCULAR | Status: DC | PRN
  Administered 2018-02-20: 25 ug via INTRAVENOUS

## 2018-02-20 MED ORDER — SODIUM CHLORIDE 0.9 % WEIGHT BASED INFUSION: 1.0000 mL/kg/h | INTRAVENOUS | Status: DC

## 2018-02-20 MED ORDER — SODIUM CHLORIDE 0.9% FLUSH
3.0000 mL | Freq: Two times a day (BID) | INTRAVENOUS | Status: DC
  Administered 2018-02-20: 3 mL via INTRAVENOUS

## 2018-02-20 SURGICAL SUPPLY — 10 items
CATH 5FR JL3.5 JR4 ANG PIG MP (CATHETERS) ×1 IMPLANT
DEVICE RAD COMP TR BAND LRG (VASCULAR PRODUCTS) ×1 IMPLANT
GLIDESHEATH SLEND SS 6F .021 (SHEATH) ×1 IMPLANT
GUIDEWIRE INQWIRE 1.5J.035X260 (WIRE) IMPLANT
INQWIRE 1.5J .035X260CM (WIRE) ×2
KIT HEART LEFT (KITS) ×2 IMPLANT
PACK CARDIAC CATHETERIZATION (CUSTOM PROCEDURE TRAY) ×2 IMPLANT
SYR MEDRAD MARK 7 150ML (SYRINGE) ×1 IMPLANT
TRANSDUCER W/STOPCOCK (MISCELLANEOUS) ×2 IMPLANT
TUBING CIL FLEX 10 FLL-RA (TUBING) ×2 IMPLANT

## 2018-02-20 NOTE — Progress Notes (Addendum)
Attending MD note  Patient was seen, examined,treatment plan was discussed with the PA-S.  I have personally reviewed the clinical findings, lab, imaging studies and management of this patient in detail. I agree with the documentation, as recorded by the PA-Student  Edwin Randall is a 78 y.o. year old male with medical history significant for gout, hypertension, depression, hyperlipidemia who presented on 02/18/2018 with acute changes in vision and presyncope and was admitted for further evaluation due to concern for TIA.  Also history was obtained by wife, patient also corroborated.  Prior to admission patient suddenly developed sensation of feeling like he was about to fall but did not with associated with some double vision particularly while looking at his hand noting will like almost a shadow.  His wife reports he had a similar episode approximately 3 weeks ago, at that time he did not respond to her questions and had a blank stare with reported confusion.  Patient also reports progressive fatigue for several weeks but denies any chest pain, palpitations, shortness of breath, cough, recent sick contacts, loss of sensation.    In the ED and was found to have  blood pressure was noted to be 186/91.  Admitting lab work was unremarkable.  Due to his unsteadiness and reported visual changes neurology was consulted in ED and recommended MRI/MRA due to concern for small brainstem TIA.  Patient was admitted for further evaluation.  Physical:     Vitals:   02/19/18 0709 02/19/18 1356  BP: (!) 167/91 (!) 141/66  Pulse: (!) 58 76  Resp: 19 18  Temp: 97.9 F (36.6 C) 98.5 F (36.9 C)  SpO2: 99% 97%    Constitutional:normal appearing male Eyes: EOMI, anicteric, normal conjunctivae ENMT: Oropharynx with moist mucous membranes, normal dentition Neck: FROM Cardiovascular: RRR no MRGs, with no peripheral edema Respiratory: Normal respiratory effort, clear breath sounds  Abdomen:  Soft,non-tender, Skin: No rash ulcers, or lesions. Without skin tenting  Neurologic: Moving all extremities with no appreciable focal deficits. Psychiatric:Appropriate affect, and mood. Mental status AAOx3  Plan  1. Double vision and unsteadiness presumed secondary to TIA, improving.  This afternoon denies any current changes in vision reports steady gait which seems consistent with TIA given quick resolution of symptoms and no ischemic abnormalities noted on MRI brain, MRA head, and no stenosis/occlusion or aneurysm on carotid Dopplers.  Additionally in terms of risk factors blood pressures well controlled and A1c was within normal limits neurology recommends DAPT with aspirin and Plavix for 3 weeks followed by aspirin alone.  No need for PT eval  2. Reduced EF and diffuse hypokinesis, new.  Found on TTE obtained for risk factor evaluation for presumed TIA.  EF 40-45%, however on left heart cath on 1/10 shows preserved EF with moderate nonobstructive CAD.  Cardiology recommends considering Entresto, continue to avoid beta-blockers   3. Intermittent bradycardia, stable  not having any symptoms with ambulation.  Cardiology says no indication for pacemaker at this place.  Will need event monitor as outpatient.  4. Hypertension at goal continue home amlodipine, losartan  5. Hyperlipidemia, stable continue home statin  6. Gout, stable continue home allopurinol  7. Depression, stable continue home Wellbutrin 8.  9. Disposition: We will change from observation to inpatient given patient needed left heart cath due to reduced EF to rule out ischemic etiology.  Doing well from that standpoint, but awaiting further recommendations from cardiology given possibility may start Entresto patient is having some elevated blood pressure and  have to avoid beta-blockers given bradycardia.  Additionally we will continue to monitor to ensure no worsening bradycardia at rest.  Rest as below  Laverna PeaceShayla D    [facility]        PROGRESS NOTE  Edwin Randall [id number] DOB: Jan 21, 1941 DOA: 02/18/2018 PCP: Merri BrunettePharr, Walter, MD  HPI/Brief Narrative  Edwin Randall is a 78 y.o. year old male with medical history significant for HTN, HLD, Depression, and Gout who presented on 02/18/2018 with diplopia and imbalance and was admitted for evaluation of TIA. He was initially evaluated by his PCP, who sent him to the ER. Patient states that on 02/18/18 around noon he felt a "falling sensation" where he states his legs didn't feel like they were working properly. He reports associated blurry vision that started at the same time that he describes as seeing a shadow around his fingers when he holds them out in front of them. He sat down during the episode and checked his BP, which was normal, but noticed his HR was around 40 bpm. He ate lunch and states his HR improved within an hour. His other sx's improved as the day progressed. He also has hx of chronic tinnitus d/t hx of concussions, which he states significantly worsened during his episode yesterday. He denies any hx of smoking.   Hospital Course: In the ED, the patient had a negative stroke screen, MRI of the brain, and CT of the head. Neuro was consulted due to the possible TIA, who suspected diplopia and started prophylactic therapy with ASA and Plavix. He was found to have new reduced EF and diffuse hypokinesis on TTE yesterday and was evaluated by cardiology, who recommends LHC for evaluation of possible coronary disease. He is awaiting catheterization now. He has also been experiencing intermittent episodes of bradycardia while in the hospital that was evaluated by cardiology.   Subjective Patient is overall doing well today. He is up and ambulating. He denies any more episodes of vision changes or disequilibrium since yesterday. He denies any CP, SOB, palpitations, or edema.   Assessment/Plan:  #1 Diplopia/TIA- Symptoms resolved. No evidence  of any neuro deficit today.  Walking with steady gait today. He is still on prophylactic therapy with ASA and Plavix. Per cardiology, plan is Plavix and ASA therapy for 3 weeks and then just ASA. PT witnessed patient ambulating around unit without difficulty and discussed with pt and wife and concluded patient does not need PT evaluation at this time.   #2 Reduced EF and Diffuse Hypokinesis- New. 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 Patient denies any CP, SOB, or palpitations today. No evidence of edema today. Cardiology evaluated the patient and recommends LHC to r/o coronary dz as well as possible therapy with ACEI/ARB or ARNI once the permissive HTN has timed out for heart failure. Patient agrees to move forward with LHC. He is awaiting catheterization and is NPO.   #3 Intermittent Bradycardia- Stable. When patient arrived his HR was fluctuating from 40's to 60's during both sleep and wake periods. His latest vitals this morning showed HR stable at 66 bpm. Patient continues to be on telemetry. He was evaluated by cardiology for his bradycardia, who did not see any life threatening arrhythmias or indication for pace maker at this time. They ordered event monitor for patient to be completed and followed-up as outpatient.   #4 HTN- Stable. Continues home meds of Amlodipine and  Losartan.   #5 HLD- Stable. Continue Statin  #6 Gout- Stable. Continue Allopurinol.  #7 Depression- Stable. Continue Wellbutrin.   Cultures:  none  Telemetry: Patient is on Telemetry   DVT prophylaxis: Lovenox  Consultants:  Neurology, Cardiology   Procedures:  Awaiting LHC  Antimicrobials: none  Code Status: FULL   Family Communication: Patient's wife was in room with patient and updated on plan.   Disposition Plan:   Anticipate discharge home pending completion of LHC.     Objective: Vitals:   02/20/18 0520 02/20/18 1130 02/20/18 1144 02/20/18 1220  BP: (!) 143/63  (!) 160/71   Pulse: 66  (!) 50   Resp: 18  18   Temp: 98.4 F (36.9 C)  98 F (36.7 C)   TempSrc: Oral  Oral   SpO2: 98%  96% 100%  Weight:  84.2 kg    Height:  5\' 11"  (1.803 m)      Intake/Output Summary (Last 24 hours) at 02/20/2018 1227 Last data filed at 02/20/2018 0838 Gross per 24 hour  Intake 0 ml  Output -  Net 0 ml   Filed Weights   02/18/18 1848 02/20/18 1130  Weight: 88.5 kg 84.2 kg    Exam:  Constitutional:normal appearing male. Eyes: EOMI, anicteric, normal conjunctivae.  ENMT: Oropharynx with moist mucous membranes, normal dentition Neck: FROM Cardiovascular: RRR no MRGs, with no peripheral edema Respiratory: Normal respiratory effort, clear breath sounds  Skin: No rash ulcers, or lesions. Without skin tenting. No signs of edema.  Neurologic: Grossly no focal neuro deficit. Psychiatric:Appropriate affect, and mood. Mental status AAOx3  Data Reviewed: CBC: Recent Labs  Lab 02/18/18 2036 02/19/18 0002 02/20/18 0633  WBC 5.9 6.3 6.0  NEUTROABS 3.5  --   --   HGB 14.4 14.6 14.8  HCT 42.7 42.0 41.7  MCV 92.2 92.5 92.7  PLT 207 223 233   Basic Metabolic Panel: Recent Labs  Lab 02/18/18 1940 02/19/18 0002 02/20/18 0633  NA 136  --  138  K 3.6  --  4.0  CL 105  --  105  CO2 23  --  24  GLUCOSE 101*  --  117*  BUN 16  --  14  CREATININE 1.00 0.99 1.03  CALCIUM 8.2*  --  9.0   GFR: Estimated Creatinine Clearance: 63 mL/min (by C-G formula based on SCr of 1.03 mg/dL). Liver Function Tests: No results for input(s): AST, ALT, ALKPHOS, BILITOT, PROT, ALBUMIN in the last 168 hours. No results for input(s): LIPASE, AMYLASE in the last 168 hours. No results for input(s): AMMONIA in the last 168 hours. Coagulation Profile: Recent Labs  Lab 02/20/18 1045  INR 1.07   Cardiac Enzymes: No results for input(s): CKTOTAL, CKMB, CKMBINDEX, TROPONINI in  the last 168 hours. BNP (last 3 results) No results for input(s): PROBNP in the last 8760 hours. HbA1C: Recent Labs    02/19/18 0300  HGBA1C 5.6   CBG: No results for input(s): GLUCAP in the last 168 hours. Lipid Profile: Recent Labs    02/19/18 0300  CHOL 136  HDL 51  LDLCALC 63  TRIG 108  CHOLHDL 2.7   Thyroid Function Tests: No results for input(s): TSH, T4TOTAL, FREET4, T3FREE, THYROIDAB in the last 72 hours. Anemia Panel: No results for input(s): VITAMINB12, FOLATE, FERRITIN, TIBC, IRON, RETICCTPCT in the last 72 hours. Urine analysis: No results found for: COLORURINE, APPEARANCEUR, LABSPEC, PHURINE, GLUCOSEU, HGBUR, BILIRUBINUR, KETONESUR, PROTEINUR, UROBILINOGEN, NITRITE, LEUKOCYTESUR Sepsis Labs: @LABRCNTIP (procalcitonin:4,lacticidven:4)  )No results found for  this or any previous visit (from the past 240 hour(s)).    Studies: No results found.  Scheduled Meds: . [MAR Hold]  stroke: mapping our early stages of recovery book   Does not apply Once  . [MAR Hold] allopurinol  100 mg Oral Daily  . [MAR Hold] amLODipine  2.5 mg Oral Daily  . [MAR Hold] aspirin EC  81 mg Oral Daily  . [MAR Hold] buPROPion  300 mg Oral Daily  . [MAR Hold] clopidogrel  75 mg Oral Daily  . [MAR Hold] enoxaparin (LOVENOX) injection  40 mg Subcutaneous Daily  . [MAR Hold] folic acid  1 mg Oral Daily  . [MAR Hold] losartan  100 mg Oral Daily  . [MAR Hold] multivitamin with minerals  1 tablet Oral Daily  . [MAR Hold] rosuvastatin  5 mg Oral Daily  . sodium chloride flush  3 mL Intravenous Q12H    Continuous Infusions: . sodium chloride    . sodium chloride       LOS: 0 days     Jake Shark, PA-S  02/20/2018, 12:27 PM

## 2018-02-20 NOTE — Progress Notes (Signed)
STROKE TEAM PROGRESS NOTE   INTERVAL HISTORY His wife is at the bedside.  Patient in bed, NAD. Patient hard of hearing. He was seen by cardiology and is scheduled to undergo cardiac cath later today   Vitals:   02/20/18 1402 02/20/18 1417 02/20/18 1430 02/20/18 1502  BP: 136/63 (!) 150/78 137/76 (!) 177/76  Pulse: 64 (!) 54 61 62  Resp:      Temp:      TempSrc:      SpO2: 99% 98% 100% 97%  Weight:      Height:        CBC:  Recent Labs  Lab 02/18/18 2036 02/19/18 0002 02/20/18 0633  WBC 5.9 6.3 6.0  NEUTROABS 3.5  --   --   HGB 14.4 14.6 14.8  HCT 42.7 42.0 41.7  MCV 92.2 92.5 92.7  PLT 207 223 233    Basic Metabolic Panel:  Recent Labs  Lab 02/18/18 1940 02/19/18 0002 02/20/18 0633  NA 136  --  138  K 3.6  --  4.0  CL 105  --  105  CO2 23  --  24  GLUCOSE 101*  --  117*  BUN 16  --  14  CREATININE 1.00 0.99 1.03  CALCIUM 8.2*  --  9.0   Lipid Panel:     Component Value Date/Time   CHOL 136 02/19/2018 0300   TRIG 108 02/19/2018 0300   HDL 51 02/19/2018 0300   CHOLHDL 2.7 02/19/2018 0300   VLDL 22 02/19/2018 0300   LDLCALC 63 02/19/2018 0300   HgbA1c:  Lab Results  Component Value Date   HGBA1C 5.6 02/19/2018   Urine Drug Screen: No results found for: LABOPIA, COCAINSCRNUR, LABBENZ, AMPHETMU, THCU, LABBARB  Alcohol Level     Component Value Date/Time   ETH 139 (H) 02/10/2014 1850    IMAGING Ct Head Wo Contrast  Result Date: 02/18/2018 CLINICAL DATA:  Diplopia. EXAM: CT HEAD WITHOUT CONTRAST TECHNIQUE: Contiguous axial images were obtained from the base of the skull through the vertex without intravenous contrast. COMPARISON:  Head CT 06/12/2007 FINDINGS: Brain: Age related atrophy. Mild to moderate chronic small vessel ischemia. No intracranial hemorrhage, mass effect, or midline shift. No hydrocephalus. The basilar cisterns are patent. No evidence of territorial infarct or acute ischemia. No extra-axial or intracranial fluid collection. Vascular:  Atherosclerosis of skullbase vasculature without hyperdense vessel or abnormal calcification. Skull: No fracture or focal lesion. Sinuses/Orbits: Resolve mucosal thickening from prior exam. Paranasal sinuses and mastoid air cells are clear. Visualized orbits are unremarkable. Other: None. IMPRESSION: 1. No acute intracranial abnormality. 2. Atrophy and chronic small vessel ischemia. Electronically Signed   By: Narda RutherfordMelanie  Sanford M.D.   On: 02/18/2018 20:28   Mr Maxine GlennMra Head Wo Contrast  Result Date: 02/19/2018 CLINICAL DATA:  Diplopia and ataxia episode, now resolved. Persistent towards onto diplopia. EXAM: MRA HEAD WITHOUT CONTRAST TECHNIQUE: Angiographic images of the Circle of Willis were obtained using MRA technique without intravenous contrast. COMPARISON:  MRI study yesterday. FINDINGS: Both internal carotid arteries are widely patent into the brain. No siphon stenosis. The anterior and middle cerebral vessels are patent without proximal stenosis, aneurysm or vascular malformation. Both vertebral arteries are widely patent to the basilar. No basilar stenosis. Posterior circulation branch vessels appear normal. IMPRESSION: Normal examination.  No stenosis, occlusion or aneurysm. Electronically Signed   By: Paulina FusiMark  Shogry M.D.   On: 02/19/2018 08:42   Mr Brain Wo Contrast  Result Date: 02/18/2018 CLINICAL DATA:  78  y/o M; Diplopia and ataxia now resolved with persistent horizontal diplopia. EXAM: MRI HEAD WITHOUT CONTRAST TECHNIQUE: Multiplanar, multiecho pulse sequences of the brain and surrounding structures were obtained without intravenous contrast. COMPARISON:  02/18/2017 CT head. FINDINGS: Brain: No acute infarction, hemorrhage, hydrocephalus, extra-axial collection or mass lesion. Few punctate nonspecific T2 FLAIR hyperintensities in subcortical and periventricular white matter are compatible with mild chronic microvascular ischemic changes for age. Mild volume loss of the brain. Vascular: Normal flow  voids. Skull and upper cervical spine: Normal marrow signal. Sinuses/Orbits: Mild mucosal thickening of the bilateral maxillary sinus alveolar recesses. No abnormal signal of the additional visible paranasal sinuses or the mastoid air cells. Orbits are unremarkable. Other: None. IMPRESSION: 1. No acute intracranial abnormality identified. 2. Mild for age chronic microvascular ischemic changes and volume loss of the brain. Electronically Signed   By: Mitzi Hansen M.D.   On: 02/18/2018 22:50   Vas US Carotid  Result Date: 02/19/2018 Carotid Arterial Duplex Study Indications:  TIA, Visual disturbance and gait. Risk Factors: Hypertension, hyperlipidemia. Performing Technologist: Sherren Kerns RVS  Examination Guidelines: A complete evaluation includes B-mode imaging, spectral Doppler, color Doppler, and power Doppler as needed of all accessible portions of each vessel. Bilateral testing is considered an integral part of a complete examination. Limited examinations for reoccurring indications may be performed as noted.  Right Carotid Findings: +----------+--------+--------+--------+---------------------+------------------+           PSV cm/sEDV cm/sStenosisDescribe             Comments           +----------+--------+--------+--------+---------------------+------------------+ CCA Prox  93      15                                   intimal thickening +----------+--------+--------+--------+---------------------+------------------+ CCA Distal65      12                                   intimal thickening +----------+--------+--------+--------+---------------------+------------------+ ICA Prox  57      14              irregular and                                                             calcific                                +----------+--------+--------+--------+---------------------+------------------+ ICA Distal70      26                                                       +----------+--------+--------+--------+---------------------+------------------+ ECA       93      11                                                      +----------+--------+--------+--------+---------------------+------------------+ +----------+--------+-------+--------+-------------------+  PSV cm/sEDV cmsDescribeArm Pressure (mmHG) +----------+--------+-------+--------+-------------------+ Subclavian88                                         +----------+--------+-------+--------+-------------------+ +---------+--------+--+--------+--+ VertebralPSV cm/s47EDV cm/s13 +---------+--------+--+--------+--+  Left Carotid Findings: +----------+--------+--------+--------+------------+------------------+           PSV cm/sEDV cm/sStenosisDescribe    Comments           +----------+--------+--------+--------+------------+------------------+ CCA Prox  69      14                          intimal thickening +----------+--------+--------+--------+------------+------------------+ CCA Distal62      15                          intimal thickening +----------+--------+--------+--------+------------+------------------+ ICA Prox  51      8               heterogenous                   +----------+--------+--------+--------+------------+------------------+ ICA Distal34      10                                             +----------+--------+--------+--------+------------+------------------+ ECA       128     26                                             +----------+--------+--------+--------+------------+------------------+ +----------+--------+--------+--------+-------------------+ SubclavianPSV cm/sEDV cm/sDescribeArm Pressure (mmHG) +----------+--------+--------+--------+-------------------+           111                                         +----------+--------+--------+--------+-------------------+  +---------+--------+--+--------+-+ VertebralPSV cm/s32EDV cm/s5 +---------+--------+--+--------+-+  Summary: Right Carotid: The extracranial vessels were near-normal with only minimal wall                thickening or plaque. Left Carotid: The extracranial vessels were near-normal with only minimal wall               thickening or plaque. Vertebrals:  Bilateral vertebral arteries demonstrate antegrade flow. Subclavians: Normal flow hemodynamics were seen in bilateral subclavian              arteries. *See table(s) above for measurements and observations.  Electronically signed by Lemar Livings MD on 02/19/2018 at 2:40:56 PM.    Final     PHYSICAL EXAM  Pleasant obese elderly caucasian male not in distress. . Afebrile. Head is nontraumatic. Neck is supple without bruit.    Cardiac exam no murmur or gallop. Lungs are clear to auscultation. Distal pulses are well felt.i Neurological Exam ;  Awake  Alert oriented x 3. Normal speech and language.eye movements full without nystagmus.fundi were not visualized. Vision acuity and fields appear normal. Hearing is normal. Palatal movements are normal. Face symmetric. Tongue midline. Normal strength, tone, reflexes and coordination. Normal sensation. Gait deferred.   ASSESSMENT/PLAN Mr. Edwin Randall is a 78 y.o. male with history of  HTN, HLD presenting with unsteadiness and visual changes.    Posterior circulation TIA:  secondary to  small vessel disease source  CT head 1. No acute intracranial abnormality. 2. Atrophy and chronic small vessel ischemia.  MRI  1. No acute intracranial abnormality identified. 2. Mild for age chronic microvascular ischemic changes and volume loss of the brain.  MRA Normal examination.  No stenosis, occlusion or aneurysm.  Carotid Doppler  Right Carotid: The extracranial vessels were near-normal with only minimal wall thickening or plaque. Left Carotid: The extracranial vessels were near-normal with only minimal wall  thickening or plaque. Vertebrals:  Bilateral vertebral arteries demonstrate antegrade flow. Subclavians: Normal flow hemodynamics were seen in bilateral subclavian arteries.  2D Echo   Left ventricle: The cavity size was moderately dilated. Systolic   function was mildly to moderately reduced. The estimated ejection   fraction was in the range of 40% to 45%. Diffuse hypokinesis.   Doppler parameters are consistent with abnormal left ventricular   relaxation (grade 1 diastolic dysfunction). - Aortic valve: Sclerosis without stenosis. Valve area (VTI): 1.76   cm^2. Valve area (Vmax): 1.46 cm^2. Valve area (Vmean): 1.59   cm^2. - Mitral valve: Calcified annulus. Mildly thickened leaflets .   There was mild regurgitation. Valve area by pressure half-time:   2.44 cm^2. - Left atrium: The atrium was moderately dilated. - Atrial septum: Poorly visualized but appears mobile cannot r/o PFO.  LDL 63  HgbA1c 5.6  Lovenox for VTE prophylaxis Diet Order            Diet Heart Room service appropriate? Yes; Fluid consistency: Thin  Diet effective now              No antithrombotic prior to admission, now on aspirin 81 mg daily and clopidogrel 75 mg daily. For 3 weeks and then Aspirin 81mg  daily.  Therapy recommendations:  pending  Disposition:  pending  Hypertension  Stable . Permissive hypertension (OK if < 220/120) but gradually normalize in 5-7 days . Long-term BP goal normotensive  Hyperlipidemia  Home meds:  Crestor 10 mg, Crestor 5 mgresumed in hospital  LDL 63, goal < 70  Continue statin at discharge  Diabetes type II  HgbA1c 5.6, goal < 7.0  Controlled  Other Stroke Risk Factors  Advanced age   Other Active Problems   Hospital day # 0      He presented with transient vision difficulties and gait ataxia likely due to posterior circulation TIA but patient and family feels this has happened on several occasions in the setting of bradycardia and he has not  had any prior cardiac evaluation. Agree with cardiology workup for his possible arrhythmias and coronary disease.. Aspirin and Plavix for 3 weeks followed by aspirin alone. Aggressive risk factor modification. Long discussion with the patient and wife and answered questions. Greater than 50% time during this 25 minute visit was spent on counseling and coordination of care about his TIA and bradycardia and answering questions. Follow-up in the stroke clinic in 6 weeks. Stroke team will sign off. Kindly call for questions.  Delia Heady, MD Medical Director [facility] Pager: [phone] 02/20/2018 3:28 PM  To contact Stroke Continuity provider, please refer to [email or web address]. After hours, contact General Neurology

## 2018-02-20 NOTE — Progress Notes (Signed)
Progress Note  Patient Name: Edwin Randall Date of Encounter: 02/20/2018  Primary Cardiologist:  Chrystie Nose, MD  Subjective   No chest pain or SOB. Ambulated a little w/out difficulty  Inpatient Medications    Scheduled Meds: .  stroke: mapping our early stages of recovery book   Does not apply Once  . allopurinol  100 mg Oral Daily  . amLODipine  2.5 mg Oral Daily  . aspirin EC  81 mg Oral Daily  . buPROPion  300 mg Oral Daily  . clopidogrel  75 mg Oral Daily  . enoxaparin (LOVENOX) injection  40 mg Subcutaneous Daily  . folic acid  1 mg Oral Daily  . losartan  100 mg Oral Daily  . multivitamin with minerals  1 tablet Oral Daily  . rosuvastatin  5 mg Oral Daily   Continuous Infusions:  PRN Meds: acetaminophen **OR** acetaminophen (TYLENOL) oral liquid 160 mg/5 mL **OR** acetaminophen, LORazepam   Vital Signs    Vitals:   02/19/18 1356 02/19/18 1700 02/20/18 0020 02/20/18 0520  BP: (!) 141/66 (!) 147/79 140/72 (!) 143/63  Pulse: 76 71 79 66  Resp: 18  18 18   Temp: 98.5 F (36.9 C) 98.3 F (36.8 C) 98.2 F (36.8 C) 98.4 F (36.9 C)  TempSrc: Oral Oral Oral Oral  SpO2: 97% 96% 97% 98%  Weight:      Height:        Intake/Output Summary (Last 24 hours) at 02/20/2018 0953 Last data filed at 02/20/2018 6270 Gross per 24 hour  Intake 0 ml  Output -  Net 0 ml   Filed Weights   02/18/18 1848  Weight: 88.5 kg    Telemetry    SR, sinus brady 40s, PACs, sinus arrhythmia - Personally Reviewed  ECG    None today - Personally Reviewed  Physical Exam   General: Well developed, well nourished, male appearing in no acute distress. Head: Normocephalic, atraumatic.  Neck: Supple without bruits, JVD not elevated. Lungs:  Resp regular and unlabored, CTA. Heart: RRR, S1, S2, no S3, S4, soft murmur; no rub. Abdomen: Soft, non-tender, non-distended with normoactive bowel sounds. No hepatomegaly. No rebound/guarding. No obvious abdominal masses. Extremities: No  clubbing, cyanosis, no edema. Distal pedal pulses are 2+ bilaterally. Neuro: Alert and oriented X 3. Moves all extremities spontaneously. Psych: Normal affect.  Labs    Hematology Recent Labs  Lab 02/18/18 2036 02/19/18 0002 02/20/18 0633  WBC 5.9 6.3 6.0  RBC 4.63 4.54 4.50  HGB 14.4 14.6 14.8  HCT 42.7 42.0 41.7  MCV 92.2 92.5 92.7  MCH 31.1 32.2 32.9  MCHC 33.7 34.8 35.5  RDW 12.1 12.0 12.0  PLT 207 223 233    Chemistry Recent Labs  Lab 02/18/18 1940 02/19/18 0002 02/20/18 0633  NA 136  --  138  K 3.6  --  4.0  CL 105  --  105  CO2 23  --  24  GLUCOSE 101*  --  117*  BUN 16  --  14  CREATININE 1.00 0.99 1.03  CALCIUM 8.2*  --  9.0  GFRNONAA >60 >60 >60  GFRAA >60 >60 >60  ANIONGAP 8  --  9      Radiology    Ct Head Wo Contrast  Result Date: 02/18/2018 CLINICAL DATA:  Diplopia. EXAM: CT HEAD WITHOUT CONTRAST TECHNIQUE: Contiguous axial images were obtained from the base of the skull through the vertex without intravenous contrast. COMPARISON:  Head CT 06/12/2007 FINDINGS: Brain:  78 related atrophy. Mild to moderate chronic small vessel ischemia. No intracranial hemorrhage, mass effect, or midline shift. No hydrocephalus. The basilar cisterns are patent. No evidence of territorial infarct or acute ischemia. No extra-axial or intracranial fluid collection. Vascular: Atherosclerosis of skullbase vasculature without hyperdense vessel or abnormal calcification. Skull: No fracture or focal lesion. Sinuses/Orbits: Resolve mucosal thickening from prior exam. Paranasal sinuses and mastoid air cells are clear. Visualized orbits are unremarkable. Other: None. IMPRESSION: 1. No acute intracranial abnormality. 2. Atrophy and chronic small vessel ischemia. Electronically Signed   By: Narda RutherfordMelanie  Sanford M.D.   On: 02/18/2018 20:28   Mr Maxine GlennMra Head Wo Contrast  Result Date: 02/19/2018 CLINICAL DATA:  Diplopia and ataxia episode, now resolved. Persistent towards onto diplopia. EXAM: MRA  HEAD WITHOUT CONTRAST TECHNIQUE: Angiographic images of the Circle of Willis were obtained using MRA technique without intravenous contrast. COMPARISON:  MRI study yesterday. FINDINGS: Both internal carotid arteries are widely patent into the brain. No siphon stenosis. The anterior and middle cerebral vessels are patent without proximal stenosis, aneurysm or vascular malformation. Both vertebral arteries are widely patent to the basilar. No basilar stenosis. Posterior circulation branch vessels appear normal. IMPRESSION: Normal examination.  No stenosis, occlusion or aneurysm. Electronically Signed   By: Paulina FusiMark  Shogry M.D.   On: 02/19/2018 08:42   Mr Brain Wo Contrast  Result Date: 02/18/2018 CLINICAL DATA:  [age] y/o M; Diplopia and ataxia now resolved with persistent horizontal diplopia. EXAM: MRI HEAD WITHOUT CONTRAST TECHNIQUE: Multiplanar, multiecho pulse sequences of the brain and surrounding structures were obtained without intravenous contrast. COMPARISON:  02/18/2017 CT head. FINDINGS: Brain: No acute infarction, hemorrhage, hydrocephalus, extra-axial collection or mass lesion. Few punctate nonspecific T2 FLAIR hyperintensities in subcortical and periventricular white matter are compatible with mild chronic microvascular ischemic changes for age. Mild volume loss of the brain. Vascular: Normal flow voids. Skull and upper cervical spine: Normal marrow signal. Sinuses/Orbits: Mild mucosal thickening of the bilateral maxillary sinus alveolar recesses. No abnormal signal of the additional visible paranasal sinuses or the mastoid air cells. Orbits are unremarkable. Other: None. IMPRESSION: 1. No acute intracranial abnormality identified. 2. Mild for age chronic microvascular ischemic changes and volume loss of the brain. Electronically Signed   By: Mitzi HansenLance  Furusawa-Stratton M.D.   On: 02/18/2018 22:50   Vas [place] Carotid  Result Date: 02/19/2018 Carotid Arterial Duplex Study Indications:  TIA, Visual disturbance  and gait. Risk Factors: Hypertension, hyperlipidemia. Performing Technologist: Sherren Kernsandace Kanady RVS  Examination Guidelines: A complete evaluation includes B-mode imaging, spectral Doppler, color Doppler, and power Doppler as needed of all accessible portions of each vessel. Bilateral testing is considered an integral part of a complete examination. Limited examinations for reoccurring indications may be performed as noted.  Right Carotid Findings: +----------+--------+--------+--------+---------------------+------------------+           PSV cm/sEDV cm/sStenosisDescribe             Comments           +----------+--------+--------+--------+---------------------+------------------+ CCA Prox  93      15                                   intimal thickening +----------+--------+--------+--------+---------------------+------------------+ CCA Distal65      12  intimal thickening +----------+--------+--------+--------+---------------------+------------------+ ICA Prox  57      14              irregular and                                                             calcific                                +----------+--------+--------+--------+---------------------+------------------+ ICA Distal70      26                                                      +----------+--------+--------+--------+---------------------+------------------+ ECA       93      11                                                      +----------+--------+--------+--------+---------------------+------------------+ +----------+--------+-------+--------+-------------------+           PSV cm/sEDV cmsDescribeArm Pressure (mmHG) +----------+--------+-------+--------+-------------------+ [id number]                                         +----------+--------+-------+--------+-------------------+ +---------+--------+--+--------+--+ VertebralPSV cm/s47EDV  cm/s13 +---------+--------+--+--------+--+  Left Carotid Findings: +----------+--------+--------+--------+------------+------------------+           PSV cm/sEDV cm/sStenosisDescribe    Comments           +----------+--------+--------+--------+------------+------------------+ CCA Prox  69      14                          intimal thickening +----------+--------+--------+--------+------------+------------------+ CCA Distal62      15                          intimal thickening +----------+--------+--------+--------+------------+------------------+ ICA Prox  51      8               heterogenous                   +----------+--------+--------+--------+------------+------------------+ ICA Distal34      10                                             +----------+--------+--------+--------+------------+------------------+ ECA       128     26                                             +----------+--------+--------+--------+------------+------------------+ +----------+--------+--------+--------+-------------------+ SubclavianPSV cm/sEDV cm/sDescribeArm Pressure (mmHG) +----------+--------+--------+--------+-------------------+           111                                         +----------+--------+--------+--------+-------------------+ +---------+--------+--+--------+-+  VertebralPSV cm/s32EDV cm/s5 +---------+--------+--+--------+-+  Summary: Right Carotid: The extracranial vessels were near-normal with only minimal wall                thickening or plaque. Left Carotid: The extracranial vessels were near-normal with only minimal wall               thickening or plaque. Vertebrals:  Bilateral vertebral arteries demonstrate antegrade flow. Subclavians: Normal flow hemodynamics were seen in bilateral subclavian              arteries. *See table(s) above for measurements and observations.  Electronically signed by Lemar Livings MD on 02/19/2018 at 2:40:56 PM.    Final       Cardiac Studies   ECHO: 02/19/2017 - Left ventricle: The cavity size was moderately dilated. Systolic function was mildly to moderately reduced. The estimated ejection fraction was in the range of 40% to 45%. Diffuse hypokinesis. Doppler parameters are consistent with abnormal left ventricular relaxation (grade 1 diastolic dysfunction). - Aortic valve: Sclerosis without stenosis. Valve area (VTI): 1.76 cm^2. Valve area (Vmax): 1.46 cm^2. Valve area (Vmean): 1.59 cm^2. - Mitral valve: Calcified annulus. Mildly thickened leaflets . There was mild regurgitation. Valve area by pressure half-time: 2.44 cm^2. - Left atrium: The atrium was moderately dilated. - Atrial septum: Poorly visualized but appears mobile cannot r/o PFO.  Patient Profile     78 y.o. male w/ hx HTN, HLD, gout, who was admitted 01/08 for ?TIA, cards seeing for decreased EF and bradycardia   Assessment & Plan    1. LVD:  - no ischemic sx - needs ischemic eval, pt agreeable to cath. - The risks and benefits of a cardiac catheterization including, but not limited to, death, stroke, MI, kidney damage and bleeding were discussed with the patient and his wife by Dr Rennis Golden, they indicate understanding and agree to proceed.  - pt on board, orders written. - hold losartan for cath, give ASA, Plavix and Wellbutrin - hold other meds, can give additional amlodipine prn for BP control - no BB 2nd bradycardia - not volume overloaded  2. Bradycardia - no sx w/ ambulation today - HR 40s at times, unclear if has chronotropic incompetence - no indication for PPM at this time - event monitor as outpt  Otherwise, per IM Principal Problem:   Diplopia Active Problems:   Essential hypertension   Gout   Hyperlipidemia   Unsteady gait   Bradycardia   TIA (transient ischemic attack)    SignedTheodore Demark , PA-C 9:53 AM 02/20/2018 Pager: [phone]

## 2018-02-20 NOTE — Progress Notes (Signed)
PT Cancellation Note  Patient Details Name: Edwin Randall MRN: [id number] DOB: 1940/05/02   Cancelled Treatment:    Reason Eval/Treat Not Completed: PT screened, no needs identified, will sign off. Noted that pt was independent with mobility during OT session yesterday. Observed pt and wife ambulating around the unit, and pt appears to be mobilizing without difficulty. PT discussed with pt and wife and they both agree that pt is near baseline and does not require a PT evaluation at this time. If needs change, please reconsult.    Marylynn Pearson 02/20/2018, 11:18 AM   Conni Slipper, PT, DPT Acute Rehabilitation Services Pager: [phone] Office: [phone]

## 2018-02-21 ENCOUNTER — Other Ambulatory Visit: Payer: Self-pay | Admitting: Physician Assistant

## 2018-02-21 MED ORDER — ASPIRIN 81 MG PO TBEC: 81.0000 mg | DELAYED_RELEASE_TABLET | Freq: Every day | ORAL | 0 refills | Status: AC

## 2018-02-21 MED ORDER — CLOPIDOGREL BISULFATE 75 MG PO TABS: 75.0000 mg | ORAL_TABLET | Freq: Every day | ORAL | 0 refills | Status: AC

## 2018-02-21 NOTE — Discharge Summary (Signed)
Discharge Summary  IAM SENDER JGO:[id number] DOB: 21-Jul-1940  PCP: Merri Brunette, MD  Admit date: 02/18/2018 Discharge date: 02/21/2018   Time spent: > 35 minutes  Admitted From: home Disposition:  home  Recommendations for Outpatient Follow-up:  1. Follow up with PCP in 1 week 2. OUtpt Follow up: Follow-up in the stroke clinic in 6 weeks, 4 to 6 weeks with Dr. Rennis Golden (cardiology) 3. New medications: aspirin 81 mg and plavix 75 mg x 3 weeks ( 19 days left) then aspirin 81 mg alone, 30-day event monitor    Discharge Diagnoses:  Active Hospital Problems   Diagnosis Date Noted  . Diplopia 02/18/2018  . LV dysfunction   . Gout 02/19/2018  . Hyperlipidemia 02/19/2018  . Unsteady gait 02/19/2018  . Bradycardia 02/19/2018  . TIA (transient ischemic attack) 02/19/2018  . Essential hypertension 02/18/2018    Resolved Hospital Problems  No resolved problems to display.    Discharge Condition: Stable   CODE STATUS:FULL   History of present illness:  Edwin Randall a 78 y.o.year old malewith medical history significant for gout, hypertension, depression, hyperlipidemiawho presented on 1/8/2020with acute changes in vision and presyncope and was admitted for further evaluation due to concern for TIA.  Also history was obtained by wife, patient also corroborated. Prior to admission patient suddenly developed sensation of feeling like he was about to fall but did not withassociated with some double vision particularly whilelooking at his hand noting will like almost a shadow. His wife reports he had a similar episode approximately 3 weeks ago, at that time he did not respond to her questions and had a blank stare with reported confusion. Patient also reports progressive fatigue for several weeks but denies any chest pain, palpitations, shortness of breath, cough, recent sick contacts, loss of sensation.   In theED and was found to haveblood pressure was noted to be  186/91. Admitting lab work was unremarkable. Due to his unsteadiness and reported visual changes neurology was consulted in ED and recommended MRI/MRA due to concern for small brainstem TIA. Patient was admitted for further evaluation. Remaining hospital course addressed in problem based format below:   Hospital Course:   Double vision and unsteady gait presumed secondary to TIA, improving CT head, MRI brain, MRA head are all negative for ischemic abnormalities or stenosis/occlusion or aneurysm.  Carotid Dopplers were also unremarkable.  Neurology did evaluate and felt symptoms are consistent with TIA given resolution and no significant imaging findings.  Risk factor work-up was unremarkable with LDL 63, A1c 5.6, and blood pressure remaining at goal.  Aspirin and Plavix will need to be continued for 3 weeks followed by aspirin alone per neurology recommendations.  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  He was not volume overloaded on exam, cardiology recommends continue losartan and home amlodipine.  Intermittent bradycardia Remained asymptomatic during hospital stay even with ambulation, per cardiology no indication for does not recommend pacemaker at this time.  Cardiology plans for 30-day event monitor to determine if there acute symptomatic episodes associated.  Patient also follow with Dr. Rennis Golden in clinic in 4-6 weeks.  Hypertension Continue home amlodipine and losartan  Hyperlipidemia, stable LDL 63, goal less than 70.  Continue home statin at discharge.  Gout stable Continue home allopurinol  Depression, stable Continue home Wellbutrin  Consultations:  Neurology, cardiology  Procedures/Studies: 1/9 carotid duplex bilaterally  Summary: Right  Carotid: The extracranial vessels were near-normal with only minimal wall                thickening or plaque.  Left Carotid: The extracranial vessels were near-normal with only minimal wall               thickening or plaque.  Vertebrals:  Bilateral vertebral arteries demonstrate antegrade flow. Subclavians: Normal flow hemodynamics were seen in bilateral subclavian              arteries. 1/9 TTE Study Conclusions  - Left ventricle: The cavity size was moderately dilated. Systolic   function was mildly to moderately reduced. The estimated ejection   fraction was in the range of 40% to 45%. Diffuse hypokinesis.   Doppler parameters are consistent with abnormal left ventricular   relaxation (grade 1 diastolic dysfunction). - Aortic valve: Sclerosis without stenosis. Valve area (VTI): 1.76   cm^2. Valve area (Vmax): 1.46 cm^2. Valve area (Vmean): 1.59   cm^2. - Mitral valve: Calcified annulus. Mildly thickened leaflets .   There was mild regurgitation. Valve area by pressure half-time:   2.44 cm^2. - Left atrium: The atrium was moderately dilated. - Atrial septum: Poorly visualized but appears mobile cannot r/o   PFO.  1/10 left heart cath   The left ventricular systolic function is normal.  LV end diastolic pressure is normal.  The left ventricular ejection fraction is 55-65% by visual estimate.   1. Minimal nonobstructive CAD 2. Normal LV function EF 60-65% 3. Low LVEDP  Discharge Exam: BP (!) 146/80 (BP Location: Left Arm)   Pulse (!) 59   Temp 98.2 F (36.8 C) (Oral)   Resp 20   Ht 5\' 11"  (1.803 m)   Wt 84.6 kg Comment: scale b  SpO2 99%   BMI 26.00 kg/m   General: Lying in bed, no apparent distress Eyes: EOMI, anicteric ENT: Oral Mucosa clear and moist Cardiovascular:bradycardic, normal rhythm, no murmurs, rubs or gallops, no edema, Respiratory: Normal respiratory effort on room air, lungs clear to auscultation bilaterally Abdomen: soft, non-distended,  non-tender, normal bowel sounds Skin: No Rash Neurologic: Grossly no focal neuro deficit.Mental status AAOx3, speech normal, Psychiatric:Appropriate affect, and mood   Discharge Instructions You were cared for by a hospitalist during your hospital stay. If you have any questions about your discharge medications or the care you received while you were in the hospital after you are discharged, you can call the unit and asked to speak with the hospitalist on call if the hospitalist that took care of you is not available. Once you are discharged, your primary care physician will handle any further medical issues. Please note that NO REFILLS for any discharge medications will be authorized once you are discharged, as it is imperative that you return to your primary care physician (or establish a relationship with a primary care physician if you do not have one) for your aftercare needs so that they can reassess your need for medications and monitor your lab values.  Discharge Instructions    Diet - low sodium heart healthy   Complete by:  As directed    Increase activity slowly   Complete by:  As directed      Allergies as of 02/21/2018      Reactions   Demerol [meperidine] Nausea And Vomiting      Medication List    STOP taking these medications  oxyCODONE-acetaminophen 5-325 MG tablet Commonly known as:  PERCOCET/ROXICET     TAKE these medications   allopurinol 100 MG tablet Commonly known as:  ZYLOPRIM Take 100 mg by mouth daily.   amLODipine 5 MG tablet Commonly known as:  NORVASC Take 2.5-5 mg by mouth daily.   aspirin 81 MG EC tablet Take 1 tablet (81 mg total) by mouth daily. Start taking on:  February 22, 2018   buPROPion 150 MG 24 hr tablet Commonly known as:  WELLBUTRIN XL Take 300 mg by mouth daily.   clopidogrel 75 MG tablet Commonly known as:  PLAVIX Take 1 tablet (75 mg total) by mouth daily for 19 days. Start taking on:  February 22, 2018   folic acid 1 MG  tablet Commonly known as:  FOLVITE Take 1 mg by mouth daily.   ketoconazole 2 % shampoo Commonly known as:  NIZORAL Apply 1 application topically daily as needed for dry skin.   LORazepam 1 MG tablet Commonly known as:  ATIVAN Take 1 mg by mouth daily as needed for anxiety.   losartan 100 MG tablet Commonly known as:  COZAAR Take 100 mg by mouth daily.   multivitamin with minerals Tabs tablet Take 1 tablet by mouth daily.   rosuvastatin 10 MG tablet Commonly known as:  CRESTOR Take 5 mg by mouth daily.      Allergies  Allergen Reactions  . Demerol [Meperidine] Nausea And Vomiting      The results of significant diagnostics from this hospitalization (including imaging, microbiology, ancillary and laboratory) are listed below for reference.    Significant Diagnostic Studies: Ct Head Wo Contrast  Result Date: 02/18/2018 CLINICAL DATA:  Diplopia. EXAM: CT HEAD WITHOUT CONTRAST TECHNIQUE: Contiguous axial images were obtained from the base of the skull through the vertex without intravenous contrast. COMPARISON:  Head CT 06/12/2007 FINDINGS: Brain: Age related atrophy. Mild to moderate chronic small vessel ischemia. No intracranial hemorrhage, mass effect, or midline shift. No hydrocephalus. The basilar cisterns are patent. No evidence of territorial infarct or acute ischemia. No extra-axial or intracranial fluid collection. Vascular: Atherosclerosis of skullbase vasculature without hyperdense vessel or abnormal calcification. Skull: No fracture or focal lesion. Sinuses/Orbits: Resolve mucosal thickening from prior exam. Paranasal sinuses and mastoid air cells are clear. Visualized orbits are unremarkable. Other: None. IMPRESSION: 1. No acute intracranial abnormality. 2. Atrophy and chronic small vessel ischemia. Electronically Signed   By: Narda RutherfordMelanie  Sanford M.D.   On: 02/18/2018 20:28   Mr Maxine GlennMra Head Wo Contrast  Result Date: 02/19/2018 CLINICAL DATA:  Diplopia and ataxia episode, now  resolved. Persistent towards onto diplopia. EXAM: MRA HEAD WITHOUT CONTRAST TECHNIQUE: Angiographic images of the Circle of Willis were obtained using MRA technique without intravenous contrast. COMPARISON:  MRI study yesterday. FINDINGS: Both internal carotid arteries are widely patent into the brain. No siphon stenosis. The anterior and middle cerebral vessels are patent without proximal stenosis, aneurysm or vascular malformation. Both vertebral arteries are widely patent to the basilar. No basilar stenosis. Posterior circulation branch vessels appear normal. IMPRESSION: Normal examination.  No stenosis, occlusion or aneurysm. Electronically Signed   By: Paulina FusiMark  Shogry M.D.   On: 02/19/2018 08:42   Mr Brain Wo Contrast  Result Date: 02/18/2018 CLINICAL DATA:  78 y/o M; Diplopia and ataxia now resolved with persistent horizontal diplopia. EXAM: MRI HEAD WITHOUT CONTRAST TECHNIQUE: Multiplanar, multiecho pulse sequences of the brain and surrounding structures were obtained without intravenous contrast. COMPARISON:  02/18/2017 CT head. FINDINGS: Brain: No acute infarction,  hemorrhage, hydrocephalus, extra-axial collection or mass lesion. Few punctate nonspecific T2 FLAIR hyperintensities in subcortical and periventricular white matter are compatible with mild chronic microvascular ischemic changes for age. Mild volume loss of the brain. Vascular: Normal flow voids. Skull and upper cervical spine: Normal marrow signal. Sinuses/Orbits: Mild mucosal thickening of the bilateral maxillary sinus alveolar recesses. No abnormal signal of the additional visible paranasal sinuses or the mastoid air cells. Orbits are unremarkable. Other: None. IMPRESSION: 1. No acute intracranial abnormality identified. 2. Mild for age chronic microvascular ischemic changes and volume loss of the brain. Electronically Signed   By: Mitzi Hansen M.D.   On: 02/18/2018 22:50   Vas US Carotid  Result Date: 02/19/2018 Carotid  Arterial Duplex Study Indications:  TIA, Visual disturbance and gait. Risk Factors: Hypertension, hyperlipidemia. Performing Technologist: Sherren Kerns RVS  Examination Guidelines: A complete evaluation includes B-mode imaging, spectral Doppler, color Doppler, and power Doppler as needed of all accessible portions of each vessel. Bilateral testing is considered an integral part of a complete examination. Limited examinations for reoccurring indications may be performed as noted.  Right Carotid Findings: +----------+--------+--------+--------+---------------------+------------------+           PSV cm/sEDV cm/sStenosisDescribe             Comments           +----------+--------+--------+--------+---------------------+------------------+ CCA Prox  93      15                                   intimal thickening +----------+--------+--------+--------+---------------------+------------------+ CCA Distal65      12                                   intimal thickening +----------+--------+--------+--------+---------------------+------------------+ ICA Prox  57      14              irregular and                                                             calcific                                +----------+--------+--------+--------+---------------------+------------------+ ICA Distal70      26                                                      +----------+--------+--------+--------+---------------------+------------------+ ECA       93      11                                                      +----------+--------+--------+--------+---------------------+------------------+ +----------+--------+-------+--------+-------------------+           PSV cm/sEDV cmsDescribeArm Pressure (mmHG) +----------+--------+-------+--------+-------------------+ [id number]                                         +----------+--------+-------+--------+-------------------+  +---------+--------+--+--------+--+  VertebralPSV cm/s47EDV cm/s13 +---------+--------+--+--------+--+  Left Carotid Findings: +----------+--------+--------+--------+------------+------------------+           PSV cm/sEDV cm/sStenosisDescribe    Comments           +----------+--------+--------+--------+------------+------------------+ CCA Prox  69      14                          intimal thickening +----------+--------+--------+--------+------------+------------------+ CCA Distal62      15                          intimal thickening +----------+--------+--------+--------+------------+------------------+ ICA Prox  51      8               heterogenous                   +----------+--------+--------+--------+------------+------------------+ ICA Distal34      10                                             +----------+--------+--------+--------+------------+------------------+ ECA       128     26                                             +----------+--------+--------+--------+------------+------------------+ +----------+--------+--------+--------+-------------------+ SubclavianPSV cm/sEDV cm/sDescribeArm Pressure (mmHG) +----------+--------+--------+--------+-------------------+           111                                         +----------+--------+--------+--------+-------------------+ +---------+--------+--+--------+-+ VertebralPSV cm/s32EDV cm/s5 +---------+--------+--+--------+-+  Summary: Right Carotid: The extracranial vessels were near-normal with only minimal wall                thickening or plaque. Left Carotid: The extracranial vessels were near-normal with only minimal wall               thickening or plaque. Vertebrals:  Bilateral vertebral arteries demonstrate antegrade flow. Subclavians: Normal flow hemodynamics were seen in bilateral subclavian              arteries. *See table(s) above for measurements and observations.   Electronically signed by Lemar Livings MD on 02/19/2018 at 2:40:56 PM.    Final     Microbiology: No results found for this or any previous visit (from the past 240 hour(s)).   Labs: Basic Metabolic Panel: Recent Labs  Lab 02/18/18 1940 02/19/18 0002 02/20/18 0633  NA 136  --  138  K 3.6  --  4.0  CL 105  --  105  CO2 23  --  24  GLUCOSE 101*  --  117*  BUN 16  --  14  CREATININE 1.00 0.99 1.03  CALCIUM 8.2*  --  9.0   Liver Function Tests: No results for input(s): AST, ALT, ALKPHOS, BILITOT, PROT, ALBUMIN in the last 168 hours. No results for input(s): LIPASE, AMYLASE in the last 168 hours. No results for input(s): AMMONIA in the last 168 hours. CBC: Recent Labs  Lab 02/18/18 2036 02/19/18 0002 02/20/18 0633  WBC 5.9 6.3 6.0  NEUTROABS 3.5  --   --  HGB 14.4 14.6 14.8  HCT 42.7 42.0 41.7  MCV 92.2 92.5 92.7  PLT 207 223 233   Cardiac Enzymes: No results for input(s): CKTOTAL, CKMB, CKMBINDEX, TROPONINI in the last 168 hours. BNP: BNP (last 3 results) No results for input(s): BNP in the last 8760 hours.  ProBNP (last 3 results) No results for input(s): PROBNP in the last 8760 hours.  CBG: No results for input(s): GLUCAP in the last 168 hours.     Signed:  Laverna PeaceShayla D Havoc Sanluis, MD [facility] 02/21/2018, 3:10 PM

## 2018-02-21 NOTE — Progress Notes (Addendum)
Progress Note  Patient Name: Edwin Randall Date of Encounter: 02/21/2018  Primary Cardiologist:  Chrystie Nose, MD  Subjective   No chest pain or SOB, no dizziness.  Inpatient Medications    Scheduled Meds: .  stroke: mapping our early stages of recovery book   Does not apply Once  . allopurinol  100 mg Oral Daily  . amLODipine  2.5 mg Oral Daily  . aspirin EC  81 mg Oral Daily  . buPROPion  300 mg Oral Daily  . clopidogrel  75 mg Oral Daily  . enoxaparin (LOVENOX) injection  40 mg Subcutaneous Q24H  . folic acid  1 mg Oral Daily  . losartan  100 mg Oral Daily  . multivitamin with minerals  1 tablet Oral Daily  . rosuvastatin  5 mg Oral Daily  . sodium chloride flush  3 mL Intravenous Q12H   Continuous Infusions: . sodium chloride     PRN Meds: sodium chloride, acetaminophen **OR** acetaminophen (TYLENOL) oral liquid 160 mg/5 mL **OR** acetaminophen, LORazepam, sodium chloride flush   Vital Signs    Vitals:   02/20/18 1725 02/20/18 1915 02/21/18 0541 02/21/18 0926  BP: (!) 177/77 (!) 156/81 (!) 167/72 (!) 143/77  Pulse: (!) 59 67 68 62  Resp: 18 18 18    Temp: 98.2 F (36.8 C) 97.7 F (36.5 C) 98.4 F (36.9 C)   TempSrc: Oral Oral Oral   SpO2: 98% 97% 95% 97%  Weight:   84.6 kg   Height:        Intake/Output Summary (Last 24 hours) at 02/21/2018 1126 Last data filed at 02/21/2018 0900 Gross per 24 hour  Intake 2204.15 ml  Output 800 ml  Net 1404.15 ml   Filed Weights   02/20/18 1130 02/20/18 1328 02/21/18 0541  Weight: 84.2 kg 84.4 kg 84.6 kg    Telemetry    SR, SB 40s at times, PVCs - rare  - Personally Reviewed  ECG    None today - Personally Reviewed  Physical Exam   General: Well developed, well nourished, male in no acute distress Head: Eyes PERRLA, No xanthomas.   Normocephalic and atraumatic Lungs: Clear bilaterally to auscultation. Heart: HRRR S1 S2, without RG. Soft SEM  Pulses are 2+ & equal. No JVD. Abdomen: Bowel sounds are  present, abdomen soft and non-tender without masses or  hernias noted. Msk: Normal strength and tone for age. Extremities: No clubbing, cyanosis or edema.    Skin:  No rashes or lesions noted. Neuro: Alert and oriented X 3. Psych:  Good affect, responds appropriately   Labs    Hematology Recent Labs  Lab 02/18/18 2036 02/19/18 0002 02/20/18 0633  WBC 5.9 6.3 6.0  RBC 4.63 4.54 4.50  HGB 14.4 14.6 14.8  HCT 42.7 42.0 41.7  MCV 92.2 92.5 92.7  MCH 31.1 32.2 32.9  MCHC 33.7 34.8 35.5  RDW 12.1 12.0 12.0  PLT 207 223 233    Chemistry Recent Labs  Lab 02/18/18 1940 02/19/18 0002 02/20/18 0633  NA 136  --  138  K 3.6  --  4.0  CL 105  --  105  CO2 23  --  24  GLUCOSE 101*  --  117*  BUN 16  --  14  CREATININE 1.00 0.99 1.03  CALCIUM 8.2*  --  9.0  GFRNONAA >60 >60 >60  GFRAA >60 >60 >60  ANIONGAP 8  --  9      Radiology    Ct Head  Wo Contrast  Result Date: 02/18/2018 CLINICAL DATA:  Diplopia. EXAM: CT HEAD WITHOUT CONTRAST TECHNIQUE: Contiguous axial images were obtained from the base of the skull through the vertex without intravenous contrast. COMPARISON:  Head CT 06/12/2007 FINDINGS: Brain: Age related atrophy (78) Mild to moderate chronic small vessel ischemia. No intracranial hemorrhage, mass effect, or midline shift. No hydrocephalus. The basilar cisterns are patent. No evidence of territorial infarct or acute ischemia. No extra-axial or intracranial fluid collection. Vascular: Atherosclerosis of skullbase vasculature without hyperdense vessel or abnormal calcification. Skull: No fracture or focal lesion. Sinuses/Orbits: Resolve mucosal thickening from prior exam. Paranasal sinuses and mastoid air cells are clear. Visualized orbits are unremarkable. Other: None. IMPRESSION: 1. No acute intracranial abnormality. 2. Atrophy and chronic small vessel ischemia. Electronically Signed   By: Narda RutherfordMelanie  Sanford M.D.   On: 02/18/2018 20:28   Mr Maxine GlennMra Head Wo Contrast  Result Date:  02/19/2018 CLINICAL DATA:  Diplopia and ataxia episode, now resolved. Persistent towards onto diplopia. EXAM: MRA HEAD WITHOUT CONTRAST TECHNIQUE: Angiographic images of the Circle of Willis were obtained using MRA technique without intravenous contrast. COMPARISON:  MRI study yesterday. FINDINGS: Both internal carotid arteries are widely patent into the brain. No siphon stenosis. The anterior and middle cerebral vessels are patent without proximal stenosis, aneurysm or vascular malformation. Both vertebral arteries are widely patent to the basilar. No basilar stenosis. Posterior circulation branch vessels appear normal. IMPRESSION: Normal examination.  No stenosis, occlusion or aneurysm. Electronically Signed   By: Paulina FusiMark  Shogry M.D.   On: 02/19/2018 08:42   Mr Brain Wo Contrast  Result Date: 02/18/2018 CLINICAL DATA:  78 y/o M; Diplopia and ataxia now resolved with persistent horizontal diplopia. EXAM: MRI HEAD WITHOUT CONTRAST TECHNIQUE: Multiplanar, multiecho pulse sequences of the brain and surrounding structures were obtained without intravenous contrast. COMPARISON:  02/18/2017 CT head. FINDINGS: Brain: No acute infarction, hemorrhage, hydrocephalus, extra-axial collection or mass lesion. Few punctate nonspecific T2 FLAIR hyperintensities in subcortical and periventricular white matter are compatible with mild chronic microvascular ischemic changes for age. Mild volume loss (78) of the brain. Vascular: Normal flow voids. Skull and upper cervical spine: Normal marrow signal. Sinuses/Orbits: Mild mucosal thickening of the bilateral maxillary sinus alveolar recesses. No abnormal signal of the additional visible paranasal sinuses or the mastoid air cells. Orbits are unremarkable. Other: None. IMPRESSION: 1. No acute intracranial abnormality identified. 2. Mild for age chronic microvascular ischemic changes and volume loss of the brain. Electronically Signed   By: Mitzi HansenLance  Furusawa-Stratton M.D.   On: 02/18/2018 22:50     Vas [place] Carotid  Result Date: 02/19/2018 Carotid Arterial Duplex Study Indications:  TIA, Visual disturbance and gait. Risk Factors: Hypertension, hyperlipidemia. Performing Technologist: Sherren Kernsandace Kanady RVS  Examination Guidelines: A complete evaluation includes B-mode imaging, spectral Doppler, color Doppler, and power Doppler as needed of all accessible portions of each vessel. Bilateral testing is considered an integral part of a complete examination. Limited examinations for reoccurring indications may be performed as noted.  Right Carotid Findings: +----------+--------+--------+--------+---------------------+------------------+           PSV cm/sEDV cm/sStenosisDescribe             Comments           +----------+--------+--------+--------+---------------------+------------------+ CCA Prox  93      15                                   intimal thickening +----------+--------+--------+--------+---------------------+------------------+ CCA  Distal65      12                                   intimal thickening +----------+--------+--------+--------+---------------------+------------------+ ICA Prox  57      14              irregular and                                                             calcific                                +----------+--------+--------+--------+---------------------+------------------+ ICA Distal70      26                                                      +----------+--------+--------+--------+---------------------+------------------+ ECA       93      11                                                      +----------+--------+--------+--------+---------------------+------------------+ +----------+--------+-------+--------+-------------------+           PSV cm/sEDV cmsDescribeArm Pressure (mmHG) +----------+--------+-------+--------+-------------------+ [id number]                                          +----------+--------+-------+--------+-------------------+ +---------+--------+--+--------+--+ VertebralPSV cm/s47EDV cm/s13 +---------+--------+--+--------+--+  Left Carotid Findings: +----------+--------+--------+--------+------------+------------------+           PSV cm/sEDV cm/sStenosisDescribe    Comments           +----------+--------+--------+--------+------------+------------------+ CCA Prox  69      14                          intimal thickening +----------+--------+--------+--------+------------+------------------+ CCA Distal62      15                          intimal thickening +----------+--------+--------+--------+------------+------------------+ ICA Prox  51      8               heterogenous                   +----------+--------+--------+--------+------------+------------------+ ICA Distal34      10                                             +----------+--------+--------+--------+------------+------------------+ ECA       128     26                                             +----------+--------+--------+--------+------------+------------------+ +----------+--------+--------+--------+-------------------+  SubclavianPSV cm/sEDV cm/sDescribeArm Pressure (mmHG) +----------+--------+--------+--------+-------------------+           111                                         +----------+--------+--------+--------+-------------------+ +---------+--------+--+--------+-+ VertebralPSV cm/s32EDV cm/s5 +---------+--------+--+--------+-+  Summary: Right Carotid: The extracranial vessels were near-normal with only minimal wall                thickening or plaque. Left Carotid: The extracranial vessels were near-normal with only minimal wall               thickening or plaque. Vertebrals:  Bilateral vertebral arteries demonstrate antegrade flow. Subclavians: Normal flow hemodynamics were seen in bilateral subclavian              arteries. *See  table(s) above for measurements and observations.  Electronically signed by Lemar LivingsBrandon Cain MD on 02/19/2018 at 2:40:56 PM.    Final      Cardiac Studies   ECHO: 02/19/2017 - Left ventricle: The cavity size was moderately dilated. Systolic function was mildly to moderately reduced. The estimated ejection fraction was in the range of 40% to 45%. Diffuse hypokinesis. Doppler parameters are consistent with abnormal left ventricular relaxation (grade 1 diastolic dysfunction). - Aortic valve: Sclerosis without stenosis. Valve area (VTI): 1.76 cm^2. Valve area (Vmax): 1.46 cm^2. Valve area (Vmean): 1.59 cm^2. - Mitral valve: Calcified annulus. Mildly thickened leaflets . There was mild regurgitation. Valve area by pressure half-time: 2.44 cm^2. - Left atrium: The atrium was moderately dilated. - Atrial septum: Poorly visualized but appears mobile cannot r/o PFO.  Patient Profile     78 y.o. male w/ hx HTN, HLD, gout, who was admitted 01/08 for ?TIA, cards seeing for decreased EF and bradycardia   Assessment & Plan    1. NICM:   - no sig CAD at cath - on ASA/Plavix for TIA - no BB 2nd low HR - no volume overload - Per Neuro, permissive HTN for now - continue losartan  - continue daily wts at home, not on diuretic  2. Bradycardia - no evidence of chronotropic incompetence - w/ neuro sx, not sure if any sx are coming from low HR - no indication for PPM at this time - ck event monitor to correlate sx w/ HR  Otherwise, per IM, no further inpt workup Principal Problem:   Diplopia Active Problems:   Essential hypertension   Gout   Hyperlipidemia   Unsteady gait   Bradycardia   TIA (transient ischemic attack)   LV dysfunction    Melida QuitterSigned, Rhonda Barrett , PA-C 11:26 AM 02/21/2018 Pager: [phone]  ---------------------------------------------------------------------------------------------   History and all data above reviewed.  Patient examined.  I  agree with the findings as above.  Fredrich BirksAlan L Kim is feeling well and anticipates discharge home.   Constitutional: No acute distress Cardiovascular: regular rhythm, normal rate, no murmurs. S1 and S2 normal. Radial site is c/d/i. No jugular venous distention.  Respiratory: clear to auscultation bilaterally GI : normal bowel sounds, soft and nontender. No distention.   MSK: extremities warm, well perfused. No edema.  NEURO: grossly nonfocal exam, moves all extremities. PSYCH: alert and oriented x 3, normal mood and affect.   All available labs, radiology testing, previous records reviewed. Agree with documented assessment and plan of my colleague as stated above with the following additions or changes:  Principal Problem:  Diplopia Active Problems:   Essential hypertension   Gout   Hyperlipidemia   Unsteady gait   Bradycardia   TIA (transient ischemic attack)   LV dysfunction    Plan: we will plan for a 30 day event monitor and f/u with Dr. Rennis Golden in clinic. Event monitor will tell us if there are acute symptomatic episodes associated with worrisome bradycardia.   [facility] will sign off.   Medication Recommendations:  Continue losartan, amlodipine and crestor Other recommendations (labs, testing, etc):  30 day event monitor Follow up as an outpatient:  4-6 weeks with Dr. Rennis Golden.    Length of Stay:  LOS: 1 day   Parke Poisson, MD [facility] 1:40 PM  02/21/2018

## 2018-02-21 NOTE — Plan of Care (Signed)

## 2018-02-21 NOTE — Evaluation (Signed)
Speech Language Pathology Evaluation Patient Details Name: Edwin Randall MRN: [id number] DOB: [birth year] Today's Date: 02/21/2018 Time: 1105-1130 SLP Time Calculation (min) (ACUTE ONLY): 25 min  Problem List:  Patient Active Problem List   Diagnosis Date Noted  . LV dysfunction   . Gout 02/19/2018  . Hyperlipidemia 02/19/2018  . Unsteady gait 02/19/2018  . Bradycardia 02/19/2018  . TIA (transient ischemic attack) 02/19/2018  . Diplopia 02/18/2018  . Essential hypertension 02/18/2018   Past Medical History:  Past Medical History:  Diagnosis Date  . Depression   . HLD (hyperlipidemia)   . Hypertension    Past Surgical History:  Past Surgical History:  Procedure Laterality Date  . ELBOW FRACTURE SURGERY    . LEFT HEART CATH AND CORONARY ANGIOGRAPHY N/A 02/20/2018   Procedure: LEFT HEART CATH AND CORONARY ANGIOGRAPHY;  Surgeon: [place], Peter M, MD;  Location: [facility] INVASIVE CV LAB;  Service: Cardiovascular;  Laterality: N/A;  . ROTATOR CUFF REPAIR     HPI:  Edwin Randall is a 78 y.o. male with history of hypertension, hyperlipidemia, depression presents to the ER with complaints of having sudden onset of symptoms of difficulty walking at around noon time.  Symptoms lasted for half hour.  The same time patient also had visual symptoms which were like every object had a shadow along with it.  Patient disequilibrium resolved by half hour but the visual symptoms persisted.  Patient went to his primary care physician Dr. Renne Crigler who referred patient to the ER.  Patient denies any weakness of the upper or lower extremities or any difficulty speaking or swallowing.  Patient states during his symptoms he checked his heart rate and it was around 40 bpm which gradually improved in the next hour.  At the PCPs office it was normal.  MRI is negative for acute findings.   Assessment / Plan / Recommendation Clinical Impression  Cognitive/linguistic and motor speech screen were completed.  Cranial nerve  exam was completed and unremarkable.  Lingual, labial, facial and jaw range of moton and strength were adequate.  Speech was clear and easy to understand.  No disernible dysarthria or apraxia was noted.  Per his wife, the patient is deliberate in his responses and he has always thought a moment prior to response to questions, etc.  He achieved a score of 27/30 on the Mini Mental State Exam suggesting functional cognitive/linguistic skills.  He was oriented x 4, had good immediate recall of 3 novel words, good attention to task and language skills appeared to be intact.   Mild difficulty noted for delayed recall.  He was unable able to independently recall 2/3 novel words.  Semantic cue facilitated recall of third novel word.   In addition, informally the patient was able to provide logical solutions to simple problems and complete a clock drawing task accurately.  Given this acute ST needs are not identified.  ST will sign off.  If we can be of further assistance please feel free to re-consult.     Mini Mental State Exam Results Orientation:  9/10 Registration:  3/3 Attention:  5/5 Recall:  2/3 Language:  8/9 Total Score:  27/30 (< 24 is considered functional)    SLP Assessment  SLP Recommendation/Assessment: Patient does not need any further Speech Lanaguage Pathology Services SLP Visit Diagnosis: Cognitive communication deficit (R41.841)    Follow Up Recommendations  None          SLP Evaluation Cognition  Overall Cognitive Status: Within Functional Limits for tasks  assessed Arousal/Alertness: Awake/alert Orientation Level: Oriented X4 Attention: Focused Focused Attention: Appears intact Memory: Appears intact Awareness: Appears intact Problem Solving: Appears intact Safety/Judgment: Appears intact       Comprehension  Auditory Comprehension Overall Auditory Comprehension: Appears within functional limits for tasks assessed Commands: Within Functional Limits Conversation:  Complex Reading Comprehension Reading Status: Within funtional limits    Expression Expression Primary Mode of Expression: Verbal Verbal Expression Overall Verbal Expression: Appears within functional limits for tasks assessed Initiation: No impairment Automatic Speech: Name;Social Response Level of Generative/Spontaneous Verbalization: Conversation Repetition: No impairment Naming: No impairment Pragmatics: No impairment Written Expression Dominant Hand: Left Written Expression: Within Functional Limits   Oral / Motor  Oral Motor/Sensory Function Overall Oral Motor/Sensory Function: Within functional limits Motor Speech Overall Motor Speech: Appears within functional limits for tasks assessed Respiration: Within functional limits Phonation: Normal Resonance: Within functional limits Articulation: Within functional limitis Intelligibility: Intelligible Motor Planning: Witnin functional limits Motor Speech Errors: Not applicable   GO                   Dimas Aguas, MA, CCC-SLP Acute Rehab SLP [phone]  Fleet Contras 02/21/2018, 11:49 AM

## 2018-02-24 ENCOUNTER — Telehealth: Payer: Self-pay | Admitting: Internal Medicine

## 2018-02-24 NOTE — Telephone Encounter (Signed)
Spoke with patient he wants to wait to schedule his hospital follow up appt after he sees his PCP tomorrow.  Will call him back tomorrow 02/15/2018 after 4pm.

## 2018-02-24 NOTE — Telephone Encounter (Signed)
-----   Message from Lucita Ferrara, [place] sent at 02/23/2018  8:17 AM EST ----- Regarding: needs monitor and appt with [facility] Please contact patient and schedule 30 day monitor and 6-8 week follow up with Dr Rennis Golden.  Thanks,  Rhae Hammock ----- Message ----- From: Jacinta Shoe Sent: 02/21/2018  12:19 PM EST To: Darrol Jump, PA-C, Lucita Ferrara, CMA  Pt seen by [facility] in-hospital. Has event monitor ordered. Please make sure that is done and put on this week. F/u with Hilty in 6-8 weeks. Thanks

## 2018-02-25 DIAGNOSIS — G459 Transient cerebral ischemic attack, unspecified: Secondary | ICD-10-CM | POA: Diagnosis not present

## 2018-02-26 ENCOUNTER — Ambulatory Visit (INDEPENDENT_AMBULATORY_CARE_PROVIDER_SITE_OTHER): Payer: Medicare Other

## 2018-02-26 ENCOUNTER — Other Ambulatory Visit: Payer: Self-pay | Admitting: Physician Assistant

## 2018-02-26 ENCOUNTER — Telehealth: Payer: Self-pay | Admitting: *Deleted

## 2018-02-26 DIAGNOSIS — R001 Bradycardia, unspecified: Secondary | ICD-10-CM | POA: Diagnosis not present

## 2018-02-26 DIAGNOSIS — G459 Transient cerebral ischemic attack, unspecified: Secondary | ICD-10-CM

## 2018-02-26 DIAGNOSIS — I429 Cardiomyopathy, unspecified: Secondary | ICD-10-CM

## 2018-02-26 NOTE — Telephone Encounter (Signed)
Relayed instruction to [employer].

## 2018-02-26 NOTE — Telephone Encounter (Signed)
Patient was not told how long not to use right arm for lifting after Cath.  Please call and advise.

## 2018-02-26 NOTE — Telephone Encounter (Signed)
This is on her discharge paperwork. Only 24 hour lifting restriction - so at this point, there is no restriction.  Dr. Rexene Edison

## 2018-04-01 ENCOUNTER — Telehealth: Payer: Self-pay | Admitting: Neurology

## 2018-04-01 NOTE — Telephone Encounter (Signed)
  The patient has an appointment to be seen in our office in March 2020.  Cardiac monitor study 04/01/18:  Narrative & Impression    Predominantly sinus bradycardia with PAC's. No atrial fibrillation.

## 2018-04-07 ENCOUNTER — Ambulatory Visit: Payer: BLUE CROSS/BLUE SHIELD | Admitting: Internal Medicine

## 2018-04-07 ENCOUNTER — Telehealth: Payer: Self-pay | Admitting: Internal Medicine

## 2018-04-07 NOTE — Telephone Encounter (Signed)
Called and spoke with Edwin Randall and his wife - provided information regarding the results of his studies. We were not able to identify and afib. As his LVEF has improved - he can follow-up with me as needed.  Chrystie Nose, MD, [facility], FACP  [place]  [facility]  Medical Director of the [facility] Lipid [facility] &  Cardiovascular Risk Reduction Clinic Diplomate of the American Board of Clinical Lipidology Attending Cardiologist  Direct Dial: [phone]  Fax: [phone]  Website:  [email or web address]

## 2018-04-07 NOTE — Telephone Encounter (Signed)
I called and spoke with the wife, she states that they were told the appointment was at 9:45, and not at 8:45. Wife was very upset, I explained that we could try to get her in with the PA, but she did not want to really start over with another provider. She states that they just want an answer on if he can continue to do his activities with his bradycardia, and how severe it is, she feels that it is not important to everyone else, and that we keep "putting it off". Also would like to discuss the test that were ran in the hospital (echo, cath, etc.) I explained that I could send a message to the nurse who was helping with Dr.Hilty today.Wife agreed.

## 2018-04-07 NOTE — Telephone Encounter (Signed)
Patient/wife came at wrong time for appointment this morning.  Wife is very upset and wants to know what the results of his monitor are.  He is scheduled to see neurologist on March 9.  Wife does not want to see any PA except Rhonda. Please call to  review monitor results.

## 2018-04-08 ENCOUNTER — Encounter: Payer: Self-pay | Admitting: *Deleted

## 2018-04-20 ENCOUNTER — Encounter: Payer: Self-pay | Admitting: Neurology

## 2018-04-20 ENCOUNTER — Ambulatory Visit (INDEPENDENT_AMBULATORY_CARE_PROVIDER_SITE_OTHER): Payer: Medicare Other | Admitting: Neurology

## 2018-04-20 VITALS — BP 148/84 | HR 59 | Ht 71.0 in | Wt 195.0 lb

## 2018-04-20 DIAGNOSIS — R5382 Chronic fatigue, unspecified: Secondary | ICD-10-CM | POA: Diagnosis not present

## 2018-04-20 DIAGNOSIS — E538 Deficiency of other specified B group vitamins: Secondary | ICD-10-CM | POA: Diagnosis not present

## 2018-04-20 DIAGNOSIS — H532 Diplopia: Secondary | ICD-10-CM | POA: Diagnosis not present

## 2018-04-20 NOTE — Progress Notes (Signed)
Reason for visit: TIA event  Referring physician: Dr. Drucilla Randall is a 78 y.o. male  History of present illness:  Edwin Randall is a 78 year old left-handed white male with a history of an event that occurred around 18 February 2018.  [facility]win [facility] was [facility] with onset of tunnel vision that occurred while at home, Edwin Randall had no history of headache associated with this.  Edwin Randall does have a prior history of migraine earlier in his life.  Edwin tunnel vision lasted for about an hour and then seemed to clear up, but Edwin Randall had what appeared to be some troubles with double vision, Edwin Randall would see a shadow image on objects, Edwin Randall had some difficulty with depth perception and would stagger a bit.  Edwin Randall did not fall, did not have slurred speech.  Edwin Randall did not completely lose his vision.  Edwin Randall did check his blood pressure around this time with a blood pressure around 140/70, but his heart rate was low when around 40.  Edwin Randall went to [facility] emergency room and was admitted, his hemoglobin A1c was 5.6.  Edwin Randall underwent MRI of Edwin brain that did not show an acute stroke, MRA of Edwin head was normal, no evidence of an embolic source was seen on 2D echocardiogram, a cardiac monitor study was done and showed sinus bradycardia but no atrial fibrillation.  Edwin Randall is on low-dose aspirin, Edwin Randall was not on aspirin before Edwin above event.  Edwin Randall has been treated for depression in December 2019, Edwin Randall has been placed on Wellbutrin around that time.  Edwin Randall has not had recurrence of Edwin visual complaints, Edwin Randall comes to this office for an evaluation.  Edwin Randall has noted his blood pressures generally run in Edwin 140-160 systolic range.  Past Medical History:  Diagnosis Date  . Anxiety   . Depression   . HLD (hyperlipidemia)   . Hypertension     Past Surgical History:  Procedure Laterality Date  . APPENDECTOMY  1966  . ELBOW FRACTURE SURGERY    . LEFT HEART CATH AND CORONARY ANGIOGRAPHY N/A 02/20/2018   Procedure:  LEFT HEART CATH AND CORONARY ANGIOGRAPHY;  Surgeon: [place], Peter M, MD;  Location: [facility] INVASIVE CV LAB;  Service: Cardiovascular;  Laterality: N/A;  . ROTATOR CUFF REPAIR      Family History  Problem Relation Age of Onset  . CAD Father 75  . Unexplained death Mother 24       old age    Social history:  reports that Edwin Randall has quit smoking. Edwin Randall has never used smokeless tobacco. Edwin Randall reports that Edwin Randall does not drink alcohol or use drugs.  Medications:  Prior to Admission medications   Medication Sig Start Date End Date Taking? Authorizing Provider  allopurinol (ZYLOPRIM) 100 MG tablet Take 100 mg by mouth daily. 12/01/14   [provider]  amLODipine (NORVASC) 5 MG tablet Take 2.5-5 mg by mouth daily.    [provider]  aspirin EC 81 MG EC tablet Take 1 tablet (81 mg total) by mouth daily. 02/22/18   Roberto Scales D, MD  buPROPion (WELLBUTRIN XL) 150 MG 24 hr tablet Take 300 mg by mouth daily.    [provider]  folic acid (FOLVITE) 1 MG tablet Take 1 mg by mouth daily. 11/29/14   [provider]  ketoconazole (NIZORAL) 2 % shampoo Apply 1 application topically daily as needed for dry skin. 11/14/14   [provider]  LORazepam (ATIVAN)  1 MG tablet Take 1 mg by mouth daily as needed for anxiety. 01/18/15   [provider]  losartan (COZAAR) 100 MG tablet Take 100 mg by mouth daily. 12/13/14   [provider]  Multiple Vitamin (MULTIVITAMIN WITH MINERALS) TABS tablet Take 1 tablet by mouth daily.    [provider]  rosuvastatin (CRESTOR) 10 MG tablet Take 5 mg by mouth daily. 11/29/14   [provider]      Allergies  Allergen Reactions  . Demerol [Meperidine] Nausea And Vomiting    ROS:  Out of a complete 14 system review of symptoms, Edwin Randall complains only of Edwin following symptoms, and all other reviewed systems are negative.  Snoring  Blood pressure (!) 148/84, pulse (!) 59, height 5\' 11"  (1.803 m),  weight 195 lb (88.5 kg).  Physical Exam  General: Edwin Randall is alert and cooperative at Edwin time of Edwin examination.  Eyes: Pupils are equal, round, and reactive to light. Discs are flat bilaterally.  Neck: Edwin neck is supple, no carotid bruits are noted.  Respiratory: Edwin respiratory examination is clear.  Cardiovascular: Edwin cardiovascular examination reveals a regular rate and rhythm, no obvious murmurs or rubs are noted.  Skin: Extremities are without significant edema.  Neurologic Exam  Mental status: Edwin Randall is alert and oriented x 3 at Edwin time of Edwin examination. Edwin Randall has apparent normal recent and remote memory, with an apparently normal attention span and concentration ability.  Cranial nerves: Facial symmetry is present. There is good sensation of Edwin face to pinprick and soft touch bilaterally. Edwin strength of Edwin facial muscles and Edwin muscles to head turning and shoulder shrug are normal bilaterally. Speech is well enunciated, no aphasia or dysarthria is noted. Extraocular movements are full. Visual fields are full. Edwin tongue is midline, and Edwin Randall has symmetric elevation of Edwin soft palate. No obvious hearing deficits are noted.  Motor: Edwin motor testing reveals 5 over 5 strength of all 4 extremities. Good symmetric motor tone is noted throughout.  Sensory: Sensory testing is intact to pinprick, soft touch, vibration sensation, and position sense on all 4 extremities. No evidence of extinction is noted.  Coordination: Cerebellar testing reveals good finger-nose-finger and heel-to-shin bilaterally.  Gait and station: Gait is normal. Tandem gait is normal. Romberg is negative. No drift is seen.  Reflexes: Deep tendon reflexes are symmetric and normal bilaterally. Toes are downgoing bilaterally.   MRI brain 02/18/18:  IMPRESSION: 1. No acute intracranial abnormality identified. 2. Mild for age chronic microvascular ischemic changes and volume loss of  Edwin brain.  * MRI scan images were reviewed online. I agree with Edwin written report.   MRA head 02/19/18:  IMPRESSION: Normal examination.  No stenosis, occlusion or aneurysm.   2D echo 02/19/18:  Study Conclusions  - Left ventricle: Edwin cavity size was moderately dilated. Systolic   function was mildly to moderately reduced. Edwin estimated ejection   fraction was in Edwin range of 40% to 45%. Diffuse hypokinesis.   Doppler parameters are consistent with abnormal left ventricular   relaxation (grade 1 diastolic dysfunction). - Aortic valve: Sclerosis without stenosis. Valve area (VTI): 1.76   cm^2. Valve area (Vmax): 1.46 cm^2. Valve area (Vmean): 1.59   cm^2. - Mitral valve: Calcified annulus. Mildly thickened leaflets .   There was mild regurgitation. Valve area by pressure half-time:   2.44 cm^2. - Left atrium: Edwin atrium was moderately dilated. - Atrial septum: Poorly visualized but appears mobile cannot  r/o   PFO.   Carotid doppler 02/19/18:  Summary: Right Carotid: Edwin extracranial vessels were near-normal with only minimal wall                thickening or plaque.  Left Carotid: Edwin extracranial vessels were near-normal with only minimal wall               thickening or plaque.  Vertebrals:  Bilateral vertebral arteries demonstrate antegrade flow. Subclavians: Normal flow hemodynamics were seen in bilateral subclavian              arteries.   Cardiac monitor study 04/01/18:  Narrative & Impression    Predominantly sinus bradycardia with PAC's. No atrial fibrillation.     Assessment/Plan:  1.  Possible TIA event  Edwin Randall has had a thorough stroke evaluation.  Edwin Randall is to remain on low-dose aspirin.  Edwin Randall will continue to check his blood pressures at home, if they seem to be running slightly high, this may require medical therapy.  Edwin Randall will be set up for blood work looking for metabolic causes of double vision.  Edwin Randall does have a prior history of  migraine, Edwin event with Edwin visual disturbance could have represented migraine equivalent.  Edwin Randall will follow-up here if needed, I will contact him concerning Edwin blood work results.  Marlan Palau MD 04/20/2018 9:39 AM  [facility] [address] [place], [place] [address]  Phone [phone] Fax [phone]

## 2018-04-21 LAB — TSH: TSH: 2.6 u[IU]/mL (ref 0.450–4.500)

## 2018-04-21 LAB — SEDIMENTATION RATE: Sed Rate: 2 mm/hr (ref 0–30)

## 2018-04-21 LAB — ACETYLCHOLINE RECEPTOR, BINDING: AChR Binding Ab, Serum: <0.03 nmol/L (ref 0.00–0.24)

## 2018-04-21 LAB — VITAMIN B12: Vitamin B-12: 442 pg/mL (ref 232–1245)

## 2018-04-22 ENCOUNTER — Telehealth: Payer: Self-pay

## 2018-04-22 NOTE — Telephone Encounter (Signed)
-----   Message from Charles K Willis, MD sent at 04/21/2018  4:48 PM EDT -----  The blood work results are unremarkable. Please call the patient. ----- Message ----- From: Interface, [facility] Lab Results In Sent: 04/21/2018   7:37 AM EDT To: Charles K Willis, MD   

## 2018-04-22 NOTE — Telephone Encounter (Signed)
-----   Message from York Spaniel, MD sent at 04/21/2018  4:48 PM EDT -----  The blood work results are unremarkable. Please call the patient. ----- Message ----- From: [facility] Lab Results In Sent: 04/21/2018   7:37 AM EDT To: York Spaniel, MD

## 2018-04-22 NOTE — Telephone Encounter (Signed)
Lvm requesting the pt to call me back.

## 2018-04-22 NOTE — Telephone Encounter (Signed)
Patient returned my call and I was able to advise on recent blood test results. He verbalized understanding and requested a copy be placed in the mail for him. Confirmed home address on file and was advised this is current.  I have placed copy in out going mail at [facility].

## 2018-04-22 NOTE — Telephone Encounter (Signed)
Pt's wife called [facility] ok per DPR). She wanted to verify if the pt started having similar symptoms (tunnel vision, double vision, trouble with depth perception and staggering) should he report to the ER? I advised if the pt was to present with these symptoms again the ER would be able to rule out a stroke and provide emergency medicine if needed. I further advised we could see the pt on a f/u basis if his symptoms worsened/failed to improve. Pt's wife was agreeable to this recommendation and had no further question/concerns.

## 2018-09-14 ENCOUNTER — Other Ambulatory Visit: Payer: Self-pay

## 2018-09-28 DIAGNOSIS — E7800 Pure hypercholesterolemia, unspecified: Secondary | ICD-10-CM | POA: Diagnosis not present

## 2018-09-28 DIAGNOSIS — I10 Essential (primary) hypertension: Secondary | ICD-10-CM | POA: Diagnosis not present

## 2018-09-28 DIAGNOSIS — Z125 Encounter for screening for malignant neoplasm of prostate: Secondary | ICD-10-CM | POA: Diagnosis not present

## 2018-10-01 DIAGNOSIS — I70209 Unspecified atherosclerosis of native arteries of extremities, unspecified extremity: Secondary | ICD-10-CM | POA: Diagnosis not present

## 2018-10-01 DIAGNOSIS — Z0001 Encounter for general adult medical examination with abnormal findings: Secondary | ICD-10-CM | POA: Diagnosis not present

## 2018-10-01 DIAGNOSIS — E7800 Pure hypercholesterolemia, unspecified: Secondary | ICD-10-CM | POA: Diagnosis not present

## 2018-10-01 DIAGNOSIS — R51 Headache: Secondary | ICD-10-CM | POA: Diagnosis not present

## 2018-10-01 DIAGNOSIS — J449 Chronic obstructive pulmonary disease, unspecified: Secondary | ICD-10-CM | POA: Diagnosis not present

## 2018-10-01 DIAGNOSIS — G43901 Migraine, unspecified, not intractable, with status migrainosus: Secondary | ICD-10-CM | POA: Diagnosis not present

## 2018-10-01 DIAGNOSIS — F329 Major depressive disorder, single episode, unspecified: Secondary | ICD-10-CM | POA: Diagnosis not present

## 2018-10-01 DIAGNOSIS — L720 Epidermal cyst: Secondary | ICD-10-CM | POA: Diagnosis not present

## 2018-10-01 DIAGNOSIS — F419 Anxiety disorder, unspecified: Secondary | ICD-10-CM | POA: Diagnosis not present

## 2018-10-01 DIAGNOSIS — M109 Gout, unspecified: Secondary | ICD-10-CM | POA: Diagnosis not present

## 2018-10-01 DIAGNOSIS — I10 Essential (primary) hypertension: Secondary | ICD-10-CM | POA: Diagnosis not present

## 2018-10-01 DIAGNOSIS — R7303 Prediabetes: Secondary | ICD-10-CM | POA: Diagnosis not present

## 2018-11-27 DIAGNOSIS — R972 Elevated prostate specific antigen [PSA]: Secondary | ICD-10-CM | POA: Diagnosis not present

## 2019-04-22 DIAGNOSIS — M79605 Pain in left leg: Secondary | ICD-10-CM | POA: Diagnosis not present

## 2019-04-22 DIAGNOSIS — M7989 Other specified soft tissue disorders: Secondary | ICD-10-CM | POA: Diagnosis not present

## 2019-10-07 DIAGNOSIS — R7303 Prediabetes: Secondary | ICD-10-CM | POA: Diagnosis not present

## 2019-10-07 DIAGNOSIS — Z6827 Body mass index (BMI) 27.0-27.9, adult: Secondary | ICD-10-CM | POA: Diagnosis not present

## 2019-10-07 DIAGNOSIS — E7800 Pure hypercholesterolemia, unspecified: Secondary | ICD-10-CM | POA: Diagnosis not present

## 2019-10-07 DIAGNOSIS — R972 Elevated prostate specific antigen [PSA]: Secondary | ICD-10-CM | POA: Diagnosis not present

## 2019-10-11 DIAGNOSIS — Z7901 Long term (current) use of anticoagulants: Secondary | ICD-10-CM | POA: Diagnosis not present

## 2019-10-11 DIAGNOSIS — E7800 Pure hypercholesterolemia, unspecified: Secondary | ICD-10-CM | POA: Diagnosis not present

## 2019-10-11 DIAGNOSIS — I2510 Atherosclerotic heart disease of native coronary artery without angina pectoris: Secondary | ICD-10-CM | POA: Diagnosis not present

## 2019-10-11 DIAGNOSIS — R7303 Prediabetes: Secondary | ICD-10-CM | POA: Diagnosis not present

## 2019-10-11 DIAGNOSIS — G459 Transient cerebral ischemic attack, unspecified: Secondary | ICD-10-CM | POA: Diagnosis not present

## 2019-10-11 DIAGNOSIS — R05 Cough: Secondary | ICD-10-CM | POA: Diagnosis not present

## 2019-10-11 DIAGNOSIS — Z0000 Encounter for general adult medical examination without abnormal findings: Secondary | ICD-10-CM | POA: Diagnosis not present

## 2019-10-11 DIAGNOSIS — I10 Essential (primary) hypertension: Secondary | ICD-10-CM | POA: Diagnosis not present

## 2020-02-19 DIAGNOSIS — S52501A Unspecified fracture of the lower end of right radius, initial encounter for closed fracture: Secondary | ICD-10-CM | POA: Diagnosis not present

## 2020-02-21 DIAGNOSIS — G8918 Other acute postprocedural pain: Secondary | ICD-10-CM | POA: Diagnosis not present

## 2020-02-21 DIAGNOSIS — Y999 Unspecified external cause status: Secondary | ICD-10-CM | POA: Diagnosis not present

## 2020-02-21 DIAGNOSIS — X58XXXA Exposure to other specified factors, initial encounter: Secondary | ICD-10-CM | POA: Diagnosis not present

## 2020-02-21 DIAGNOSIS — S52551A Other extraarticular fracture of lower end of right radius, initial encounter for closed fracture: Secondary | ICD-10-CM | POA: Diagnosis not present

## 2020-02-21 DIAGNOSIS — S52541A Smith's fracture of right radius, initial encounter for closed fracture: Secondary | ICD-10-CM | POA: Diagnosis not present

## 2020-02-29 DIAGNOSIS — S52541D Smith's fracture of right radius, subsequent encounter for closed fracture with routine healing: Secondary | ICD-10-CM | POA: Diagnosis not present

## 2020-03-23 DIAGNOSIS — S52541D Smith's fracture of right radius, subsequent encounter for closed fracture with routine healing: Secondary | ICD-10-CM | POA: Diagnosis not present

## 2020-04-20 DIAGNOSIS — S52541D Smith's fracture of right radius, subsequent encounter for closed fracture with routine healing: Secondary | ICD-10-CM | POA: Diagnosis not present

## 2020-09-25 DIAGNOSIS — H35373 Puckering of macula, bilateral: Secondary | ICD-10-CM | POA: Diagnosis not present

## 2020-09-25 DIAGNOSIS — H40053 Ocular hypertension, bilateral: Secondary | ICD-10-CM | POA: Diagnosis not present

## 2020-09-25 DIAGNOSIS — H2513 Age-related nuclear cataract, bilateral: Secondary | ICD-10-CM | POA: Diagnosis not present

## 2020-09-25 DIAGNOSIS — H10503 Unspecified blepharoconjunctivitis, bilateral: Secondary | ICD-10-CM | POA: Diagnosis not present

## 2020-10-05 DIAGNOSIS — I10 Essential (primary) hypertension: Secondary | ICD-10-CM | POA: Diagnosis not present

## 2020-10-05 DIAGNOSIS — E7801 Familial hypercholesterolemia: Secondary | ICD-10-CM | POA: Diagnosis not present

## 2020-10-05 DIAGNOSIS — R7303 Prediabetes: Secondary | ICD-10-CM | POA: Diagnosis not present

## 2020-10-12 DIAGNOSIS — F419 Anxiety disorder, unspecified: Secondary | ICD-10-CM | POA: Diagnosis not present

## 2020-10-12 DIAGNOSIS — I70209 Unspecified atherosclerosis of native arteries of extremities, unspecified extremity: Secondary | ICD-10-CM | POA: Diagnosis not present

## 2020-10-12 DIAGNOSIS — Z0000 Encounter for general adult medical examination without abnormal findings: Secondary | ICD-10-CM | POA: Diagnosis not present

## 2020-10-12 DIAGNOSIS — M109 Gout, unspecified: Secondary | ICD-10-CM | POA: Diagnosis not present

## 2020-11-09 DIAGNOSIS — E7801 Familial hypercholesterolemia: Secondary | ICD-10-CM | POA: Diagnosis not present

## 2020-11-16 DIAGNOSIS — Z79899 Other long term (current) drug therapy: Secondary | ICD-10-CM | POA: Diagnosis not present

## 2020-11-16 DIAGNOSIS — Z8673 Personal history of transient ischemic attack (TIA), and cerebral infarction without residual deficits: Secondary | ICD-10-CM | POA: Diagnosis not present

## 2020-11-16 DIAGNOSIS — E7800 Pure hypercholesterolemia, unspecified: Secondary | ICD-10-CM | POA: Diagnosis not present

## 2020-11-16 DIAGNOSIS — I10 Essential (primary) hypertension: Secondary | ICD-10-CM | POA: Diagnosis not present

## 2021-07-26 DIAGNOSIS — H40053 Ocular hypertension, bilateral: Secondary | ICD-10-CM | POA: Diagnosis not present

## 2021-07-26 DIAGNOSIS — H2513 Age-related nuclear cataract, bilateral: Secondary | ICD-10-CM | POA: Diagnosis not present

## 2021-07-26 DIAGNOSIS — H35373 Puckering of macula, bilateral: Secondary | ICD-10-CM | POA: Diagnosis not present

## 2021-07-26 DIAGNOSIS — H10503 Unspecified blepharoconjunctivitis, bilateral: Secondary | ICD-10-CM | POA: Diagnosis not present

## 2021-09-06 DIAGNOSIS — H10503 Unspecified blepharoconjunctivitis, bilateral: Secondary | ICD-10-CM | POA: Diagnosis not present

## 2021-09-06 DIAGNOSIS — H40053 Ocular hypertension, bilateral: Secondary | ICD-10-CM | POA: Diagnosis not present

## 2021-09-06 DIAGNOSIS — H35373 Puckering of macula, bilateral: Secondary | ICD-10-CM | POA: Diagnosis not present

## 2021-09-06 DIAGNOSIS — H2513 Age-related nuclear cataract, bilateral: Secondary | ICD-10-CM | POA: Diagnosis not present

## 2021-09-13 DIAGNOSIS — M25531 Pain in right wrist: Secondary | ICD-10-CM | POA: Diagnosis not present

## 2021-10-11 DIAGNOSIS — Z7982 Long term (current) use of aspirin: Secondary | ICD-10-CM | POA: Diagnosis not present

## 2021-10-11 DIAGNOSIS — I10 Essential (primary) hypertension: Secondary | ICD-10-CM | POA: Diagnosis not present

## 2021-10-11 DIAGNOSIS — E7801 Familial hypercholesterolemia: Secondary | ICD-10-CM | POA: Diagnosis not present

## 2021-10-11 DIAGNOSIS — R7303 Prediabetes: Secondary | ICD-10-CM | POA: Diagnosis not present

## 2021-10-16 DIAGNOSIS — I10 Essential (primary) hypertension: Secondary | ICD-10-CM | POA: Diagnosis not present

## 2021-10-16 DIAGNOSIS — I70209 Unspecified atherosclerosis of native arteries of extremities, unspecified extremity: Secondary | ICD-10-CM | POA: Diagnosis not present

## 2021-10-16 DIAGNOSIS — Z0000 Encounter for general adult medical examination without abnormal findings: Secondary | ICD-10-CM | POA: Diagnosis not present

## 2021-10-16 DIAGNOSIS — M109 Gout, unspecified: Secondary | ICD-10-CM | POA: Diagnosis not present

## 2021-10-24 ENCOUNTER — Other Ambulatory Visit (HOSPITAL_COMMUNITY): Payer: Self-pay | Admitting: Internal Medicine

## 2021-10-24 ENCOUNTER — Other Ambulatory Visit: Payer: Self-pay | Admitting: Internal Medicine

## 2021-10-24 DIAGNOSIS — I70209 Unspecified atherosclerosis of native arteries of extremities, unspecified extremity: Secondary | ICD-10-CM

## 2022-07-09 DIAGNOSIS — W57XXXA Bitten or stung by nonvenomous insect and other nonvenomous arthropods, initial encounter: Secondary | ICD-10-CM | POA: Diagnosis not present

## 2022-07-09 DIAGNOSIS — L03116 Cellulitis of left lower limb: Secondary | ICD-10-CM | POA: Diagnosis not present

## 2022-10-17 DIAGNOSIS — E7800 Pure hypercholesterolemia, unspecified: Secondary | ICD-10-CM | POA: Diagnosis not present

## 2022-10-17 DIAGNOSIS — R7303 Prediabetes: Secondary | ICD-10-CM | POA: Diagnosis not present

## 2022-10-21 DIAGNOSIS — E8881 Metabolic syndrome: Secondary | ICD-10-CM | POA: Diagnosis not present

## 2022-10-21 DIAGNOSIS — Z0000 Encounter for general adult medical examination without abnormal findings: Secondary | ICD-10-CM | POA: Diagnosis not present

## 2022-10-21 DIAGNOSIS — I10 Essential (primary) hypertension: Secondary | ICD-10-CM | POA: Diagnosis not present

## 2022-10-21 DIAGNOSIS — I2510 Atherosclerotic heart disease of native coronary artery without angina pectoris: Secondary | ICD-10-CM | POA: Diagnosis not present

## 2022-10-21 DIAGNOSIS — M109 Gout, unspecified: Secondary | ICD-10-CM | POA: Diagnosis not present

## 2022-10-21 DIAGNOSIS — I70209 Unspecified atherosclerosis of native arteries of extremities, unspecified extremity: Secondary | ICD-10-CM | POA: Diagnosis not present

## 2023-03-28 DIAGNOSIS — M25531 Pain in right wrist: Secondary | ICD-10-CM | POA: Diagnosis not present

## 2023-04-16 DIAGNOSIS — S52501A Unspecified fracture of the lower end of right radius, initial encounter for closed fracture: Secondary | ICD-10-CM | POA: Diagnosis not present

## 2023-04-16 DIAGNOSIS — M25531 Pain in right wrist: Secondary | ICD-10-CM | POA: Diagnosis not present

## 2023-04-16 DIAGNOSIS — M25641 Stiffness of right hand, not elsewhere classified: Secondary | ICD-10-CM | POA: Diagnosis not present

## 2023-04-22 DIAGNOSIS — M65831 Other synovitis and tenosynovitis, right forearm: Secondary | ICD-10-CM | POA: Diagnosis not present

## 2023-04-22 DIAGNOSIS — M65841 Other synovitis and tenosynovitis, right hand: Secondary | ICD-10-CM | POA: Diagnosis not present

## 2023-04-22 DIAGNOSIS — T84428A Displacement of other internal orthopedic devices, implants and grafts, initial encounter: Secondary | ICD-10-CM | POA: Diagnosis not present

## 2023-04-24 DIAGNOSIS — S52501D Unspecified fracture of the lower end of right radius, subsequent encounter for closed fracture with routine healing: Secondary | ICD-10-CM | POA: Diagnosis not present

## 2023-05-01 DIAGNOSIS — S52501A Unspecified fracture of the lower end of right radius, initial encounter for closed fracture: Secondary | ICD-10-CM | POA: Diagnosis not present

## 2023-06-04 DIAGNOSIS — M25531 Pain in right wrist: Secondary | ICD-10-CM | POA: Diagnosis not present

## 2023-10-22 DIAGNOSIS — I10 Essential (primary) hypertension: Secondary | ICD-10-CM | POA: Diagnosis not present

## 2023-10-22 DIAGNOSIS — R7303 Prediabetes: Secondary | ICD-10-CM | POA: Diagnosis not present

## 2023-10-22 DIAGNOSIS — E7800 Pure hypercholesterolemia, unspecified: Secondary | ICD-10-CM | POA: Diagnosis not present

## 2023-10-27 DIAGNOSIS — Z0000 Encounter for general adult medical examination without abnormal findings: Secondary | ICD-10-CM | POA: Diagnosis not present

## 2023-10-27 DIAGNOSIS — I2510 Atherosclerotic heart disease of native coronary artery without angina pectoris: Secondary | ICD-10-CM | POA: Diagnosis not present
